# Patient Record
Sex: Female | Born: 1945 | Race: White | Hispanic: No | Smoking: Never smoker
Health system: Southern US, Community
[De-identification: ages and names within clinical notes are randomized; demographics above are authoritative.]

## PROBLEM LIST (undated history)

## (undated) DIAGNOSIS — Z8612 Personal history of poliomyelitis: Secondary | ICD-10-CM

## (undated) DIAGNOSIS — M1990 Unspecified osteoarthritis, unspecified site: Secondary | ICD-10-CM

## (undated) DIAGNOSIS — E7800 Pure hypercholesterolemia, unspecified: Secondary | ICD-10-CM

## (undated) DIAGNOSIS — L900 Lichen sclerosus et atrophicus: Secondary | ICD-10-CM

## (undated) DIAGNOSIS — Z860100 Personal history of colon polyps, unspecified: Secondary | ICD-10-CM

## (undated) DIAGNOSIS — R51 Headache: Secondary | ICD-10-CM

## (undated) DIAGNOSIS — F411 Generalized anxiety disorder: Secondary | ICD-10-CM

## (undated) DIAGNOSIS — I10 Essential (primary) hypertension: Secondary | ICD-10-CM

## (undated) DIAGNOSIS — M797 Fibromyalgia: Secondary | ICD-10-CM

## (undated) DIAGNOSIS — K219 Gastro-esophageal reflux disease without esophagitis: Secondary | ICD-10-CM

## (undated) HISTORY — DX: Gastro-esophageal reflux disease without esophagitis: K21.9

## (undated) HISTORY — DX: Headache: R51

## (undated) HISTORY — DX: Generalized anxiety disorder: F41.1

## (undated) HISTORY — PX: ABDOMINAL HYSTERECTOMY: SHX81

## (undated) HISTORY — DX: Personal history of colon polyps, unspecified: Z86.0100

## (undated) HISTORY — DX: Lichen sclerosus et atrophicus: L90.0

## (undated) HISTORY — DX: Unspecified osteoarthritis, unspecified site: M19.90

## (undated) HISTORY — DX: Pure hypercholesterolemia, unspecified: E78.00

## (undated) HISTORY — DX: Essential (primary) hypertension: I10

## (undated) HISTORY — DX: Personal history of poliomyelitis: Z86.12

## (undated) HISTORY — PX: OTHER SURGICAL HISTORY: SHX169

## (undated) HISTORY — DX: Personal history of colonic polyps: Z86.010

## (undated) HISTORY — DX: Fibromyalgia: M79.7

---

## 1998-05-21 ENCOUNTER — Inpatient Hospital Stay (HOSPITAL_COMMUNITY): Admission: EM | Admit: 1998-05-21 | Discharge: 1998-05-22 | Payer: Self-pay | Admitting: Emergency Medicine

## 1998-08-18 ENCOUNTER — Other Ambulatory Visit: Admission: RE | Admit: 1998-08-18 | Discharge: 1998-08-18 | Payer: Self-pay | Admitting: Gastroenterology

## 1999-05-01 ENCOUNTER — Emergency Department (HOSPITAL_COMMUNITY): Admission: EM | Admit: 1999-05-01 | Discharge: 1999-05-02 | Payer: Self-pay

## 1999-05-01 ENCOUNTER — Encounter: Payer: Self-pay | Admitting: Specialist

## 1999-08-03 ENCOUNTER — Encounter: Admission: RE | Admit: 1999-08-03 | Discharge: 1999-08-03 | Payer: Self-pay | Admitting: Obstetrics and Gynecology

## 1999-09-02 ENCOUNTER — Other Ambulatory Visit: Admission: RE | Admit: 1999-09-02 | Discharge: 1999-09-02 | Payer: Self-pay | Admitting: Obstetrics and Gynecology

## 2000-08-04 ENCOUNTER — Encounter: Payer: Self-pay | Admitting: Obstetrics and Gynecology

## 2000-08-04 ENCOUNTER — Encounter: Admission: RE | Admit: 2000-08-04 | Discharge: 2000-08-04 | Payer: Self-pay | Admitting: Obstetrics and Gynecology

## 2000-09-02 ENCOUNTER — Other Ambulatory Visit: Admission: RE | Admit: 2000-09-02 | Discharge: 2000-09-02 | Payer: Self-pay | Admitting: Obstetrics and Gynecology

## 2000-12-17 ENCOUNTER — Emergency Department (HOSPITAL_COMMUNITY): Admission: EM | Admit: 2000-12-17 | Discharge: 2000-12-17 | Payer: Self-pay | Admitting: Emergency Medicine

## 2000-12-17 ENCOUNTER — Encounter: Payer: Self-pay | Admitting: Emergency Medicine

## 2001-08-07 ENCOUNTER — Encounter: Admission: RE | Admit: 2001-08-07 | Discharge: 2001-08-07 | Payer: Self-pay | Admitting: Obstetrics and Gynecology

## 2001-08-07 ENCOUNTER — Encounter: Payer: Self-pay | Admitting: Obstetrics and Gynecology

## 2001-09-04 ENCOUNTER — Other Ambulatory Visit: Admission: RE | Admit: 2001-09-04 | Discharge: 2001-09-04 | Payer: Self-pay | Admitting: Obstetrics and Gynecology

## 2002-09-11 ENCOUNTER — Encounter: Admission: RE | Admit: 2002-09-11 | Discharge: 2002-09-11 | Payer: Self-pay | Admitting: Obstetrics and Gynecology

## 2002-09-11 ENCOUNTER — Encounter: Payer: Self-pay | Admitting: Obstetrics and Gynecology

## 2003-09-24 ENCOUNTER — Encounter: Admission: RE | Admit: 2003-09-24 | Discharge: 2003-09-24 | Payer: Self-pay | Admitting: Obstetrics and Gynecology

## 2004-10-16 ENCOUNTER — Ambulatory Visit (HOSPITAL_COMMUNITY): Admission: RE | Admit: 2004-10-16 | Discharge: 2004-10-16 | Payer: Self-pay | Admitting: Obstetrics and Gynecology

## 2005-07-26 ENCOUNTER — Ambulatory Visit: Payer: Self-pay | Admitting: Pulmonary Disease

## 2005-09-28 ENCOUNTER — Ambulatory Visit: Payer: Self-pay | Admitting: Pulmonary Disease

## 2005-10-19 ENCOUNTER — Encounter: Admission: RE | Admit: 2005-10-19 | Discharge: 2005-10-19 | Payer: Self-pay | Admitting: Obstetrics and Gynecology

## 2006-08-10 ENCOUNTER — Ambulatory Visit: Payer: Self-pay | Admitting: Pulmonary Disease

## 2006-11-02 ENCOUNTER — Encounter: Admission: RE | Admit: 2006-11-02 | Discharge: 2006-11-02 | Payer: Self-pay | Admitting: Obstetrics and Gynecology

## 2007-08-11 ENCOUNTER — Ambulatory Visit: Payer: Self-pay | Admitting: Pulmonary Disease

## 2007-08-11 DIAGNOSIS — IMO0001 Reserved for inherently not codable concepts without codable children: Secondary | ICD-10-CM

## 2007-08-11 DIAGNOSIS — R519 Headache, unspecified: Secondary | ICD-10-CM | POA: Insufficient documentation

## 2007-08-11 DIAGNOSIS — F411 Generalized anxiety disorder: Secondary | ICD-10-CM | POA: Insufficient documentation

## 2007-08-11 DIAGNOSIS — E7800 Pure hypercholesterolemia, unspecified: Secondary | ICD-10-CM

## 2007-08-11 DIAGNOSIS — M159 Polyosteoarthritis, unspecified: Secondary | ICD-10-CM | POA: Insufficient documentation

## 2007-08-11 DIAGNOSIS — K219 Gastro-esophageal reflux disease without esophagitis: Secondary | ICD-10-CM | POA: Insufficient documentation

## 2007-08-11 DIAGNOSIS — I10 Essential (primary) hypertension: Secondary | ICD-10-CM | POA: Insufficient documentation

## 2007-08-11 DIAGNOSIS — R51 Headache: Secondary | ICD-10-CM

## 2007-08-11 DIAGNOSIS — D126 Benign neoplasm of colon, unspecified: Secondary | ICD-10-CM | POA: Insufficient documentation

## 2007-08-11 LAB — CONVERTED CEMR LAB
AST: 23 units/L (ref 0–37)
Albumin: 3.5 g/dL (ref 3.5–5.2)
Bilirubin, Direct: 0.1 mg/dL (ref 0.0–0.3)
Calcium: 9.6 mg/dL (ref 8.4–10.5)
Chloride: 101 meq/L (ref 96–112)
Cholesterol: 287 mg/dL (ref 0–200)
Eosinophils Relative: 5.4 % — ABNORMAL HIGH (ref 0.0–5.0)
GFR calc Af Amer: 161 mL/min
HCT: 36.3 % (ref 36.0–46.0)
Hemoglobin: 12.9 g/dL (ref 12.0–15.0)
Monocytes Absolute: 0.4 10*3/uL (ref 0.2–0.7)
Monocytes Relative: 7 % (ref 3.0–11.0)
Neutrophils Relative %: 43.6 % (ref 43.0–77.0)
Platelets: 239 10*3/uL (ref 150–400)
Potassium: 3.9 meq/L (ref 3.5–5.1)
RDW: 12.2 % (ref 11.5–14.6)
Sodium: 138 meq/L (ref 135–145)
Total Bilirubin: 0.6 mg/dL (ref 0.3–1.2)
WBC: 5.4 10*3/uL (ref 4.5–10.5)

## 2007-11-10 ENCOUNTER — Encounter: Admission: RE | Admit: 2007-11-10 | Discharge: 2007-11-10 | Payer: Self-pay | Admitting: Obstetrics and Gynecology

## 2008-05-17 ENCOUNTER — Telehealth: Payer: Self-pay | Admitting: Pulmonary Disease

## 2008-05-17 ENCOUNTER — Encounter: Payer: Self-pay | Admitting: Pulmonary Disease

## 2008-06-25 ENCOUNTER — Ambulatory Visit: Payer: Self-pay | Admitting: Gastroenterology

## 2008-07-10 ENCOUNTER — Ambulatory Visit: Payer: Self-pay | Admitting: Gastroenterology

## 2008-07-10 ENCOUNTER — Encounter: Payer: Self-pay | Admitting: Gastroenterology

## 2008-07-25 ENCOUNTER — Telehealth: Payer: Self-pay | Admitting: Gastroenterology

## 2008-07-30 ENCOUNTER — Ambulatory Visit: Payer: Self-pay | Admitting: Gastroenterology

## 2008-07-31 ENCOUNTER — Encounter: Payer: Self-pay | Admitting: Gastroenterology

## 2008-07-31 ENCOUNTER — Ambulatory Visit: Payer: Self-pay | Admitting: Gastroenterology

## 2008-08-05 ENCOUNTER — Encounter: Payer: Self-pay | Admitting: Gastroenterology

## 2008-08-06 ENCOUNTER — Ambulatory Visit: Payer: Self-pay | Admitting: Pulmonary Disease

## 2008-08-06 ENCOUNTER — Encounter: Payer: Self-pay | Admitting: Adult Health

## 2008-08-06 DIAGNOSIS — A809 Acute poliomyelitis, unspecified: Secondary | ICD-10-CM | POA: Insufficient documentation

## 2008-08-16 ENCOUNTER — Telehealth: Payer: Self-pay | Admitting: Pulmonary Disease

## 2008-08-17 ENCOUNTER — Encounter: Payer: Self-pay | Admitting: Pulmonary Disease

## 2008-08-26 ENCOUNTER — Telehealth (INDEPENDENT_AMBULATORY_CARE_PROVIDER_SITE_OTHER): Payer: Self-pay | Admitting: *Deleted

## 2008-09-04 ENCOUNTER — Telehealth: Payer: Self-pay | Admitting: Pulmonary Disease

## 2008-09-04 ENCOUNTER — Encounter: Payer: Self-pay | Admitting: Pulmonary Disease

## 2008-09-04 LAB — CONVERTED CEMR LAB
ALT: 20 units/L (ref 0–35)
AST: 23 units/L (ref 0–37)
Albumin: 3.7 g/dL (ref 3.5–5.2)
Alkaline Phosphatase: 92 units/L (ref 39–117)
BUN: 13 mg/dL (ref 6–23)
Basophils Absolute: 0 10*3/uL (ref 0.0–0.1)
Basophils Relative: 0.7 % (ref 0.0–3.0)
Bilirubin, Direct: 0.1 mg/dL (ref 0.0–0.3)
CO2: 30 meq/L (ref 19–32)
Calcium: 9.4 mg/dL (ref 8.4–10.5)
Chloride: 104 meq/L (ref 96–112)
Cholesterol: 281 mg/dL (ref 0–200)
Creatinine, Ser: 0.6 mg/dL (ref 0.4–1.2)
Crystals: NEGATIVE
Direct LDL: 113.4 mg/dL
Eosinophils Absolute: 0.2 10*3/uL (ref 0.0–0.7)
Eosinophils Relative: 4.1 % (ref 0.0–5.0)
GFR calc Af Amer: 130 mL/min
GFR calc non Af Amer: 108 mL/min
Glucose, Bld: 99 mg/dL (ref 70–99)
HCT: 37.7 % (ref 36.0–46.0)
HDL: 117.9 mg/dL (ref >39.0)
Hemoglobin, Urine: NEGATIVE
Hemoglobin: 13.2 g/dL (ref 12.0–15.0)
Ketones, ur: NEGATIVE mg/dL
Leukocytes, UA: NEGATIVE
Lymphocytes Relative: 44.5 % (ref 12.0–46.0)
MCHC: 35.1 g/dL (ref 30.0–36.0)
MCV: 93.9 fL (ref 78.0–100.0)
Monocytes Absolute: 0.4 10*3/uL (ref 0.1–1.0)
Monocytes Relative: 6.2 % (ref 3.0–12.0)
Neutro Abs: 2.5 10*3/uL (ref 1.4–7.7)
Neutrophils Relative %: 44.5 % (ref 43.0–77.0)
Nitrite: NEGATIVE
Platelets: 210 10*3/uL (ref 150–400)
Potassium: 3.7 meq/L (ref 3.5–5.1)
RBC: 4.02 M/uL (ref 3.87–5.11)
RDW: 12.6 % (ref 11.5–14.6)
Sodium: 142 meq/L (ref 135–145)
Specific Gravity, Urine: 1.02 (ref 1.000–1.03)
TSH: 1.5 microintl units/mL (ref 0.35–5.50)
Total Bilirubin: 0.5 mg/dL (ref 0.3–1.2)
Total CHOL/HDL Ratio: 2.4
Total Protein, Urine: NEGATIVE mg/dL
Total Protein: 7.5 g/dL (ref 6.0–8.3)
Triglycerides: 81 mg/dL (ref 0–149)
Urine Glucose: NEGATIVE mg/dL
Urobilinogen, UA: 0.2 (ref 0.0–1.0)
VLDL: 16 mg/dL (ref 0–40)
Vit D, 1,25-Dihydroxy: 21 — ABNORMAL LOW (ref 30–89)
WBC: 5.7 10*3/uL (ref 4.5–10.5)
pH: 6 (ref 5.0–8.0)

## 2008-09-09 ENCOUNTER — Telehealth: Payer: Self-pay | Admitting: Pulmonary Disease

## 2008-10-08 ENCOUNTER — Ambulatory Visit: Payer: Self-pay | Admitting: Pulmonary Disease

## 2008-11-11 ENCOUNTER — Encounter: Admission: RE | Admit: 2008-11-11 | Discharge: 2008-11-11 | Payer: Self-pay | Admitting: Pulmonary Disease

## 2009-01-06 ENCOUNTER — Telehealth (INDEPENDENT_AMBULATORY_CARE_PROVIDER_SITE_OTHER): Payer: Self-pay | Admitting: *Deleted

## 2009-02-26 ENCOUNTER — Encounter: Payer: Self-pay | Admitting: Obstetrics & Gynecology

## 2009-02-26 ENCOUNTER — Ambulatory Visit: Payer: Self-pay | Admitting: Obstetrics & Gynecology

## 2009-02-26 ENCOUNTER — Telehealth: Payer: Self-pay | Admitting: Pulmonary Disease

## 2009-02-26 ENCOUNTER — Other Ambulatory Visit: Admission: RE | Admit: 2009-02-26 | Discharge: 2009-02-26 | Payer: Self-pay | Admitting: Obstetrics & Gynecology

## 2009-05-23 ENCOUNTER — Ambulatory Visit: Payer: Self-pay | Admitting: Diagnostic Radiology

## 2009-05-23 ENCOUNTER — Emergency Department (HOSPITAL_BASED_OUTPATIENT_CLINIC_OR_DEPARTMENT_OTHER): Admission: EM | Admit: 2009-05-23 | Discharge: 2009-05-23 | Payer: Self-pay | Admitting: Emergency Medicine

## 2009-07-09 ENCOUNTER — Encounter (INDEPENDENT_AMBULATORY_CARE_PROVIDER_SITE_OTHER): Payer: Self-pay | Admitting: *Deleted

## 2009-08-13 ENCOUNTER — Telehealth: Payer: Self-pay | Admitting: Pulmonary Disease

## 2009-08-19 ENCOUNTER — Encounter: Payer: Self-pay | Admitting: Pulmonary Disease

## 2009-08-20 ENCOUNTER — Telehealth: Payer: Self-pay | Admitting: Pulmonary Disease

## 2009-10-09 ENCOUNTER — Ambulatory Visit: Payer: Self-pay | Admitting: Pulmonary Disease

## 2009-10-09 ENCOUNTER — Encounter: Payer: Self-pay | Admitting: Internal Medicine

## 2009-10-14 ENCOUNTER — Telehealth: Payer: Self-pay | Admitting: Pulmonary Disease

## 2009-10-19 DIAGNOSIS — E559 Vitamin D deficiency, unspecified: Secondary | ICD-10-CM | POA: Insufficient documentation

## 2009-10-19 LAB — CONVERTED CEMR LAB
ALT: 21 units/L (ref 0–35)
AST: 28 units/L (ref 0–37)
Basophils Absolute: 0 10*3/uL (ref 0.0–0.1)
Basophils Relative: 0.6 % (ref 0.0–3.0)
CO2: 28 meq/L (ref 19–32)
Cholesterol: 290 mg/dL — ABNORMAL HIGH (ref 0–200)
Eosinophils Absolute: 0.2 10*3/uL (ref 0.0–0.7)
Eosinophils Relative: 3.4 % (ref 0.0–5.0)
Glucose, Bld: 91 mg/dL (ref 70–99)
HDL: 119.8 mg/dL (ref >39.00)
Lymphs Abs: 2.7 10*3/uL (ref 0.7–4.0)
MCHC: 33.6 g/dL (ref 30.0–36.0)
MCV: 96.8 fL (ref 78.0–100.0)
Monocytes Absolute: 0.3 10*3/uL (ref 0.1–1.0)
Neutro Abs: 2.4 10*3/uL (ref 1.4–7.7)
Neutrophils Relative %: 42.8 % — ABNORMAL LOW (ref 43.0–77.0)
Platelets: 227 10*3/uL (ref 150.0–400.0)
RBC: 4.26 M/uL (ref 3.87–5.11)
RDW: 12.6 % (ref 11.5–14.6)
Total Bilirubin: 0.8 mg/dL (ref 0.3–1.2)
Total CHOL/HDL Ratio: 2
Total Protein: 8.1 g/dL (ref 6.0–8.3)
VLDL: 10.2 mg/dL (ref 0.0–40.0)
Vit D, 25-Hydroxy: 43 ng/mL (ref 30–89)

## 2009-10-21 ENCOUNTER — Telehealth: Payer: Self-pay | Admitting: Pulmonary Disease

## 2010-03-09 ENCOUNTER — Encounter: Admission: RE | Admit: 2010-03-09 | Discharge: 2010-03-09 | Payer: Self-pay | Admitting: Pulmonary Disease

## 2010-03-11 ENCOUNTER — Ambulatory Visit: Payer: Self-pay | Admitting: Obstetrics & Gynecology

## 2010-03-17 ENCOUNTER — Encounter: Admission: RE | Admit: 2010-03-17 | Discharge: 2010-03-17 | Payer: Self-pay | Admitting: Obstetrics & Gynecology

## 2010-04-07 ENCOUNTER — Ambulatory Visit: Payer: Self-pay | Admitting: Obstetrics & Gynecology

## 2010-06-23 ENCOUNTER — Telehealth (INDEPENDENT_AMBULATORY_CARE_PROVIDER_SITE_OTHER): Payer: Self-pay | Admitting: *Deleted

## 2010-10-13 ENCOUNTER — Ambulatory Visit
Admission: RE | Admit: 2010-10-13 | Discharge: 2010-10-13 | Payer: Self-pay | Source: Home / Self Care | Attending: Pulmonary Disease | Admitting: Pulmonary Disease

## 2010-10-13 ENCOUNTER — Ambulatory Visit: Payer: Self-pay | Admitting: Pulmonary Disease

## 2010-10-13 ENCOUNTER — Encounter: Payer: Self-pay | Admitting: Pulmonary Disease

## 2010-10-20 LAB — CONVERTED CEMR LAB
Albumin: 3.9 g/dL (ref 3.5–5.2)
BUN: 16 mg/dL (ref 6–23)
CO2: 27 meq/L (ref 19–32)
Calcium: 9.6 mg/dL (ref 8.4–10.5)
Chloride: 103 meq/L (ref 96–112)
Eosinophils Absolute: 0.2 10*3/uL (ref 0.0–0.7)
HDL: 86.4 mg/dL (ref >39.00)
Hemoglobin: 13 g/dL (ref 12.0–15.0)
Ketones, ur: NEGATIVE mg/dL
Lymphs Abs: 2.3 10*3/uL (ref 0.7–4.0)
MCHC: 33.9 g/dL (ref 30.0–36.0)
MCV: 94.7 fL (ref 78.0–100.0)
Monocytes Absolute: 0.4 10*3/uL (ref 0.1–1.0)
Potassium: 4.2 meq/L (ref 3.5–5.1)
Sodium: 141 meq/L (ref 135–145)
Specific Gravity, Urine: <=1.005 (ref 1.000–1.030)
TSH: 1.93 microintl units/mL (ref 0.35–5.50)
Total CHOL/HDL Ratio: 3
Total Protein: 7.2 g/dL (ref 6.0–8.3)
Triglycerides: 85 mg/dL (ref 0.0–149.0)
Urobilinogen, UA: 0.2 (ref 0.0–1.0)
VLDL: 17 mg/dL (ref 0.0–40.0)
pH: 6 (ref 5.0–8.0)

## 2010-11-08 ENCOUNTER — Encounter: Payer: Self-pay | Admitting: Obstetrics & Gynecology

## 2010-11-09 ENCOUNTER — Encounter: Payer: Self-pay | Admitting: Obstetrics & Gynecology

## 2010-11-17 NOTE — Progress Notes (Signed)
Summary: PRESCRIPT-need PA  Phone Note Call from Patient   Caller: Patient Call For: NADEL Summary of Call: INSURANCE WILL NOT COVER ALPRAZALAM  UNLESS CALL FROM DR NADEL OR NEED TO KNOW ALTERNATIVE Initial call taken by: Rickard Patience,  October 21, 2009 2:04 PM  Follow-up for Phone Call        [facility].Michel Bickers CMA  October 22, 2009 9:46 AM  I advised pt that Medicare has no alternative that is covered for alprazolam. Pt staes the letter she received rec a PA to be done for review. I advised we can try the PA to see if med will be covered, but if it is denied she will prob have to pay oput of pocket for this med. PA number is [phone]. Carron Curie CMA  October 22, 2009 10:20 AM  Spoke with [facility] and they stated that alprazolam is not covered by medicare. They will fax a denial letter and on it will be a number listed that can be called for an appeal if needed.  Once this is received I will give it to Sn to review. I have sent my self a flag to check on this fax tomorrow.  Carron Curie CMA  October 22, 2009 4:04 PM

## 2010-11-17 NOTE — Progress Notes (Signed)
Summary: flu shot----ok to get vaccine  Phone Note Call from Patient   Caller: Patient Call For: nadel Summary of Call: pt want to no if she need to get flu shot this year. Initial call taken by: Rickard Patience,  June 23, 2010 4:01 PM  Follow-up for Phone Call        called and spoke with pt.  pt wanted to know if SN recommends she gets the flu vaccine.  Pt has gotten them previously because of her husband having cancer. But since pt's husband has passed away as of 2011/03/06she was unsure if she still needed to get Flu vaccine.  Will forward message to SN to address.  Aundra Millet Reynolds LPN  June 23, 2010 4:14 PM   Additional Follow-up for Phone Call Additional follow up Details #1::        per SN---yes he does rec that she get a flu shot this year as well.  thanks and sorry to hear about her loss. Randell Loop [facility]  June 23, 2010 4:34 PM     Additional Follow-up for Phone Call Additional follow up Details #2::    called and spoke with pt.  pt aware of SN's recs to ok getting flu vaccine. pt unsure if she will get flu vaccine here or at local pharmacy- states she will go where ever is closer. pt states she will call back to schedule flu vaccine if she wishes to get it here.   Aundra Millet Reynolds LPN  June 23, 2010 4:37 PM

## 2010-11-19 NOTE — Assessment & Plan Note (Signed)
Summary: Veronica Strong   CC:  Yearly ROV & CPX....  History of Present Illness: 65 y/o WF here for a yearly follow up visit... he has multiple medical problems as noted below...     ~  October 09, 2009:  she is under considerable stress- new husb, [name], is dying w/ lung cancer & liver mets, Rx at [facility]- on Hospice now... meds refilled- see problem list below.   ~  October 13, 2010:  Yearly ROV & f/u HBP, Chol, etc... she reports much stress w/ death of her second husb & several family members this yr... medically OK w/ 13# weight reduction on diet, BP stable, trying to control Chol w/ diet alone + KrillOil now (refuses statin rx)... she sees DrDove in [place] GYN w/ BMD 25/11 showing Osteopenia & started on Alendronate...  she had seasonal flu vaccine already, and due to TDAP today (she will turn 64 soon & get Pneumovax in 2012)... wants 90d refill meds today.    Current Problem List:   HYPERTENSION (ICD-401.9) - on ASA 81mg /d,  NORVASC 10mg /d... BP= 130/72 & feeling well, takes med regularly, tolerated well... denies visual changes, CP, palipit, dizziness, syncope, dyspnea, edema, etc...  ~  12/11:  great job on diet & wt down 13# to 147# today, BP= 130/72 doing well.  ~  CXR 12/11 showed normal cor, clear lungs...  HYPERCHOLESTEROLEMIA (ICD-272.0) - on Krill Oil + diet... she has very hi HDL's... refuses statin rx.  ~  despite TChol in the 280-300 range & LDL in the 120-140 range, her HDL is in the 120-140 range, and she has chosen NOT to take statin med to bring the Total and LDL down...  ~  FLP 10/08 showed TChol 287, TG 102, HDL 125, LDL 138  ~  FLP 10/09 shows TChol 281, TG 81, HDL 118, LDL 113  ~  FLP 12/10 showed TChol 290, TG 51, HDL 120, LDL 147... offered low dose Statin, she prefers diet.  ~  FLP 12/11 showed TChol 278, TG 85, HDL 86, LDL 157... refuses even low dose statin rx.  GERD (ICD-530.81) - on OMEPRAZOLE 20mg /d... she had an EGD in 1999 w/ 4-5cmHH and mod  reflux...  COLONIC POLYPS (ICD-211.3) - had f/u colonoscopy by DrPatterson on 07/10/08- sigmiod polyp removed= tubular adenoma;  also had abn in cecum (granularity/ erosions) w/ bx showing frag of tubular adenoma w/ high grade dysplasia... repeat colonoscopy 07/31/08 showed nodular folds in the cecum but bx was neg= colonic mucosa w/ adenomatous change... he plans f/u in 4yrs.  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 she fell at home in 1993 and fractured her right prox tibia and metatarsals in her foot...  ~  she has a brace on her right leg due to hx polio...  ? of FIBROMYALGIA (ICD-729.1) - she has some fatigue & trigger points in the past...  VITAMIN D DEFICIENCY (ICD-268.9) - on Vit D 50K for 52yr then switched to 1000 u OTC daily...  ~  labs 10/09 showed Vit D level = 21... rec> Vit D 50K weekly...  ~  labd 12/10 showed Vit D level = 43... rec> switch to 2000 u daily.  Hx of POLIOMYELITIS (ICD-045.90) - she has atrophy of muscles in right leg and uses a long leg brace per [facility]... DrSue wrote her out of work on disability in 1999...  HEADACHE (ICD-784.0) - she uses ANTIVERT for dizziness Prn & OTC pain meds Prn.  ANXIETY DISORDER,  GENERALIZED (ICD-300.02) - on ALPRAZOLAM 0.5mg  Tid Prn...  Health Maintenance - GYN= DrDove in [place] on Alendronate, +Calcium, +MVI, +VitD... she is up to date on vaccinations:  gets the yearly seasonal flu vaccine;  given TDAP here 12/11;  due for Pneumovax at age 58 in 2012...   Preventive Screening-Counseling & Management  Alcohol-Tobacco     Smoking Status: never  Allergies: 1)  ! Sulfa 2)  ! Penicillin  Comments:  Nurse/Medical Assistant: The patient's medications and allergies were reviewed with the patient and were updated in the Medication and Allergy Lists.  Past History:  Past Medical History: HYPERTENSION (ICD-401.9) HYPERCHOLESTEROLEMIA (ICD-272.0) GERD (ICD-530.81) COLONIC POLYPS  (ICD-211.3) DEGENERATIVE JOINT DISEASE, GENERALIZED (ICD-715.00) FIBROMYALGIA (ICD-729.1) VITAMIN D DEFICIENCY (ICD-268.9) Hx of POLIOMYELITIS (ICD-045.90) HEADACHE (ICD-784.0) ANXIETY DISORDER, GENERALIZED (ICD-300.02)  Past Surgical History: S/P orthopedic surg related to her polio as a child S/P hysterectomy  Family History: Reviewed history from 08/06/2008 and no changes required. mother alive age 65  hx of mini strokes, CHF father deceased age 58 from prostate/bladder cancer 1 sibling(twin) alive age 10 1 sibling alive age 2  hx of mini strokes/TIA 1 sibling alive age 30  hx of stroke 1 sibling alive age 49  hx of stroke/circulation problems to legs  Social History: Reviewed history from 08/06/2008 and no changes required. widowed 1 child non smoker caffeine use:  3-4 cups per day no etoh  Review of Systems      See HPI  The patient denies anorexia, fever, weight loss, weight gain, vision loss, decreased hearing, hoarseness, chest pain, syncope, dyspnea on exertion, peripheral edema, prolonged cough, headaches, hemoptysis, abdominal pain, melena, hematochezia, severe indigestion/heartburn, hematuria, incontinence, muscle weakness, suspicious skin lesions, transient blindness, difficulty walking, depression, unusual weight change, abnormal bleeding, enlarged lymph nodes, and angioedema.         12 system review was otherw neg...  Vital Signs:  Patient profile:   65 year old female Height:      63 inches Weight:      146.38 pounds BMI:     26.02 O2 Sat:      99 % on Room air Temp:     97.6 degrees F oral Pulse rate:   70 / minute BP sitting:   130 / 72  (right arm) Cuff size:   regular  Vitals Entered By: Randell Loop CMA (October 13, 2010 8:52 AM)  O2 Sat at Rest %:  99 O2 Flow:  Room air CC: Yearly ROV & CPX... Is Patient Diabetic? No Pain Assessment Patient in pain? no      Comments meds updated today with pt   Physical Exam  Additional Exam:   WD, WN, 65 y/o WF in NAD... GENERAL:  Alert & oriented; pleasant & cooperative... HEENT:  [place]/AT, EOM-wnl, PERRLA, EACs-clear, TMs-wnl, NOSE-clear, THROAT-clear & wnl. NECK:  Supple w/ fairROM; no JVD; normal carotid impulses w/o bruits; no thyromegaly or nodules palpated; no lymphadenopathy. CHEST:  Clear to P & A; without wheezes/ rales/ or rhonchi heard... HEART:  Regular Rhythm; without murmurs/ rubs/ or gallops detected... ABDOMEN:  Soft & nontender; normal bowel sounds; no organomegaly or masses palpated... EXT:  hx polio right leg w/ musc arophy, shorter, long leg brace in place,  mild arthritic changes;  no varicose veins/ sl venous insuffic/ tr edema.... NEURO:  CN's intact; focal weakness in right leg from old polio, sensory testing normal; gait abnormal & balance fair. DERM:  No lesions noted; no rash etc...    MISC. Report  Procedure date:  10/13/2010  Findings:       ~  CXR clear, WNL.Marland Kitchen.  ~  Labs 12/27 showed WNL x for Chol & LDL... pt notified but she declines statin Rx...  Impression & Recommendations:  Problem # 1:  HYPERTENSION (ICD-401.9) BP controlled>  continue same med. Orders: 12 Lead EKG (12 Lead EKG) TLB-BMP (Basic Metabolic Panel-BMET) (80048-METABOL) TLB-Hepatic/Liver Function Pnl (80076-HEPATIC) TLB-CBC Platelet - w/Differential (85025-CBCD) TLB-Lipid Panel (80061-LIPID) TLB-TSH (Thyroid Stimulating Hormone) (84443-TSH) TLB-Udip w/ Micro (81001-URINE)  Her updated medication list for this problem includes:    Amlodipine Besylate 10 Mg Tabs (Amlodipine besylate) .Marland Kitchen... Take 1 tablet by mouth once a day  Problem # 2:  HYPERCHOLESTEROLEMIA (ICD-272.0) TChol & LDL are too high but she declines offer for low dose statin therapy... she wants to continue her "mega-red krill oil"... offered lipid clinic...  Problem # 3:  GERD (ICD-530.81) GI is stable & up to date... Her updated medication list for this problem includes:    Omeprazole 20 Mg Cpdr  (Omeprazole) .Marland Kitchen... Take 1 tablet by mouth once a day  Problem # 4:  DEGENERATIVE JOINT DISEASE, GENERALIZED (ICD-715.00) Aware>  she uses the Etod Prn... Her updated medication list for this problem includes:    Aspirin 81 Mg Tabs (Aspirin) .Marland Kitchen... Take 1 tablet by mouth once a day    Etodolac 400 Mg Tabs (Etodolac) .Marland Kitchen... Take 1 tablet by mouth two times a day as needed for arthritis pain...  Problem # 5:  Hx of POLIOMYELITIS (ICD-045.90) She has leg brace & limp...  Problem # 6:  OTHER MEDICAL PROBLEMS AS NOTED>>>  Complete Medication List: 1)  Aspirin 81 Mg Tabs (Aspirin) .... Take 1 tablet by mouth once a day 2)  Amlodipine Besylate 10 Mg Tabs (Amlodipine besylate) .... Take 1 tablet by mouth once a day 3)  Omega-3 Krill Oil 300 Mg Caps (Krill oil) .... Take 1 tablet by mouth once a day 4)  Omeprazole 20 Mg Cpdr (Omeprazole) .... Take 1 tablet by mouth once a day 5)  Black Cohosh 540 Mg Caps (Black cohosh) .... Take 1 tablet by mouth once a day 6)  Etodolac 400 Mg Tabs (Etodolac) .... Take 1 tablet by mouth two times a day as needed for arthritis pain.Marland KitchenMarland Kitchen 7)  Alendronate Sodium 70 Mg Tabs (Alendronate sodium) .... Take one tablet by mouth once weekly 8)  Calcium-vitamin D 600-200 Mg-unit Tabs (Calcium-vitamin d) .... Take 1 tablet by mouth once a day 9)  Multivitamins Tabs (Multiple vitamin) .... Take 1 tablet by mouth once a day 10)  Vitamin D 2000 Unit Tabs (Cholecalciferol) .... Take 1 tablet by mouth once a day 11)  Alprazolam 0.5 Mg Tabs (Alprazolam) .... Take one tablet three times a day as needed for nerves... 12)  Tylenol Pm Extra Strength 500-25 Mg Tabs (Diphenhydramine-apap (sleep)) .... Take 2 tablets by mouth at bedtime 13)  Vitamin C 1000 Mg Tabs (Ascorbic acid) .... Take 1 tablet by mouth once a day 14)  Meclizine Hcl 25 Mg Tabs (Meclizine hcl) .... Take 1 tab by mouth every 6h as needed for dizziness...  Other Orders: T-2 View CXR (71020TC) Tdap => 38yrs IM (14782) Admin  1st Vaccine (95621)  Patient Instructions: 1)  Today we updated your med list- see below.... 2)  We refilled your meds for 2012... 3)  Today we did your follow up CXR, EKG, and FASTING blood work... please call the "phone tree" in a few days for your lab results.Marland KitchenMarland Kitchen 4)  We also gave you the combination Tetanus vaccine called the TDAP- it is good for 66yrs.Marland KitchenMarland Kitchen 5)  Call for any problems.Marland KitchenMarland Kitchen 6)  Please schedule a follow-up appointment in 1 year, sooner as needed. Prescriptions: MECLIZINE HCL 25 MG TABS (MECLIZINE HCL) take 1 tab by mouth every 6H as needed for dizziness...  #100 x 6   Entered and Authorized by:   Michele Mcalpine MD   Signed by:   Michele Mcalpine MD on 10/13/2010   Method used:   Print then Give to Patient   RxID:   [id number] ALPRAZOLAM 0.5 MG  TABS (ALPRAZOLAM) take one tablet three times a day as needed for nerves...  #270 x 3   Entered and Authorized by:   Michele Mcalpine MD   Signed by:   Michele Mcalpine MD on 10/13/2010   Method used:   Print then Give to Patient   RxID:   [id number] ETODOLAC 400 MG  TABS (ETODOLAC) Take 1 tablet by mouth two times a day as needed for arthritis pain...  #100 x 6   Entered and Authorized by:   Michele Mcalpine MD   Signed by:   Michele Mcalpine MD on 10/13/2010   Method used:   Print then Give to Patient   RxID:   [id number] OMEPRAZOLE 20 MG  CPDR (OMEPRAZOLE) Take 1 tablet by mouth once a day  #90 x 4   Entered and Authorized by:   Michele Mcalpine MD   Signed by:   Michele Mcalpine MD on 10/13/2010   Method used:   Print then Give to Patient   RxID:   [id number] AMLODIPINE BESYLATE 10 MG  TABS (AMLODIPINE BESYLATE) Take 1 tablet by mouth once a day  #90 x 4   Entered and Authorized by:   Michele Mcalpine MD   Signed by:   Michele Mcalpine MD on 10/13/2010   Method used:   Print then Give to Patient   RxID:   [id number]    Immunization History:  Influenza Immunization History:    Influenza:  historical  (08/04/2010)  Immunizations Administered:  Tetanus Vaccine:    Vaccine Type: Tdap    Site: right deltoid    Mfr: GlaxoSmithKline    Dose: 0.5 ml    Route: IM    Given by: Randell Loop CMA    Exp. Date: 01/10/2012    Lot #: [id number]    VIS given: 09/04/08 version given October 13, 2010.

## 2010-11-30 ENCOUNTER — Telehealth: Payer: Self-pay | Admitting: Pulmonary Disease

## 2010-12-09 NOTE — Progress Notes (Signed)
Summary: life insurance papers  Phone Note Call from Patient Call back at [facility] Phone [phone]   Caller: Patient Call For: Elnore Cosens Summary of Call: pt is going to fax life ins. papers that she is requesting that dr Gaylord Seydel fill out and sign (and then fax back to ins. co). she will also need a copy mailed back to her. she just wanted dr Jodelle Green nurse to know these would be faxed either today or tomorrow to the attn: of dr Josef Tourigny. no call back needed for now.  Initial call taken by: Tivis Ringer, CNA,  November 30, 2010 3:32 PM  Follow-up for Phone Call        Will forward message to Marliss Czar so she can keep an eye out for the paperwork that pt is to be faxing over to Dr. Jodelle Green attention.Arman Filter LPN  November 30, 2010 4:00 PM   Additional Follow-up for Phone Call Additional follow up Details #1::        aware of papers being sent and will keep my eye out for these Randell Loop CMA  November 30, 2010 5:02 PM

## 2010-12-23 ENCOUNTER — Telehealth: Payer: Self-pay | Admitting: Pulmonary Disease

## 2011-01-05 NOTE — Progress Notes (Signed)
Summary: status of papers - would like copy mailed to her  Phone Note Call from Patient Call back at Home Phone [phone]   Caller: Patient Call For: Taiyana Kissler Summary of Call: Pt wants to know the status of her insurance papers. Initial call taken by: Darletta Moll,  December 23, 2010 3:30 PM  Follow-up for Phone Call        [facility] Gweneth Dimitri RN  December 23, 2010 3:54 PM  Pt returned call.  States she sent in [employer] forms to be completed by Dr. Kriste Basque in Feb.  I see where she called and left message regarding this.  However, pt states she received a letter from them stating this has not been done yet.  She would like to know the status of these forms and if SN ever received them.  States she does have a copy and if needed can fax them tomorrow.  Advised we will call back tomorrow regarding status of these forms.  Pt ok with this.  Leigh/Dr. Kriste Basque, pls advise.  Thanks! Follow-up by: Gweneth Dimitri RN,  December 23, 2010 5:22 PM  Additional Follow-up for Phone Call Additional follow up Details #1::        there are no insurance papers that have been received----if pt could fax these papers to me then we can get these filled out.  thanks Randell Loop CMA  December 24, 2010 12:33 PM   Called, spoke with pt.  She is aware papers were not received.  She will refax them today to [phone] attn: Leigh/Dr. Kriste Basque.  Will forward message to Marliss Czar so she can f/u on this. Additional Follow-up by: Gweneth Dimitri RN,  December 24, 2010 12:43 PM    Additional Follow-up for Phone Call Additional follow up Details #2::    pt calling to see if papers that were re-faxed have been received. [phone]. Tivis Ringer, CNA  December 25, 2010 10:46 AM  Marliss Czar, did you receive these forms? Crystal Jones RN  December 25, 2010 10:49 AM   i did get these papers and have placed them on SN cart so this can be filled out and sent back.  thanks Randell Loop CMA  December 25, 2010 11:37 AM   Pt aware forms received and would  like a copy mailed to her home once they are completed.  Will forward message to Leigh. Follow-up by: Gweneth Dimitri RN,  December 25, 2010 11:47 AM  Additional Follow-up for Phone Call Additional follow up Details #3:: Details for Additional Follow-up Action Taken: these papers are disability papers and will be forwarded to [employer] to be filled out and we can mail a copy to the pt once this is completed Randell Loop CMA  December 25, 2010 5:30 PM   pt aware.Carron Curie CMA  December 28, 2010 8:51 AM

## 2011-01-25 ENCOUNTER — Other Ambulatory Visit: Payer: Self-pay | Admitting: Obstetrics & Gynecology

## 2011-01-25 DIAGNOSIS — Z1231 Encounter for screening mammogram for malignant neoplasm of breast: Secondary | ICD-10-CM

## 2011-03-02 NOTE — Assessment & Plan Note (Signed)
NAME:  Veronica Strong, Veronica Strong                ACCOUNT NO.:  [id number]   MEDICAL RECORD NO.:  [id number]          PATIENT TYPE:  POB   LOCATION:  [facility] at [place]         FACILITY:  [facility]   PHYSICIAN:  Allie Bossier, MD        DATE OF BIRTH:  Jul 03, 1946   DATE OF SERVICE:  03/11/2010                                  CLINIC NOTE   IDENTIFICATION:  Ms. Mcadams is a 65 year old widowed white gravida 2,  para 1, abortus 1 who comes in here for annual exam.  She has no  particular GYN complaints.  She quit taking her Premarin last year after  her prescription ran out.  She decided not to refill it.  She has some  occasional night sweats, but nothing that she cannot cut up with and her  last year has been full of grief.  Her husband died December 17, 2009,  her mother died also in Feb 18, 2011and her brother died earlier this  year as well, but she says that she has not had to increase her Xanax  use and she is getting by.   PAST MEDICAL HISTORY:  Anxiety (Dr. Alroy Dust), depression.  She had a  history of polio at age 75 and has had a brace on her right leg since.  She has hypertension and history of colon polyps and fibromyalgia.   REVIEW OF SYSTEMS:  She is a retired [employer] operational officer.  The remainder of her  review of systems questions are negative.  She had her DEXA more than a  decade ago and her last colonoscopy was in 2010 and is scheduled again  for 2012 due to the history of precancerous polyps.   PAST SURGICAL HISTORY:  She had a TAH in 1981 for benign reasons.  She  had an appendectomy and 2 C-sections.  She has had a right knee and  thigh surgery and a right hammertoe surgery.   ALLERGIES:  No latex allergies. Drug allergies; PENICILLIN, SULFA.   SOCIAL HISTORY:  Negative for tobacco, alcohol, or drug use.   FAMILY HISTORY:  Negative for breast, GYN and colon malignancies.  Positive for osteoporosis and dimension.   PHYSICAL EXAMINATION:  GENERAL:  Well nourished, well hydrated  extremely  pleasant white female.  VITAL SIGNS:  Height 5 feet 3 inches, weight 160, blood pressure 139/83,  pulse 76.  HEENT:  Normal.  BREASTS:  Normal bilaterally.  HEART:  Regular rate and rhythm.  LUNGS:  Clear to auscultation bilaterally.  ABDOMEN:  Slightly obese, benign.  PELVIC:  External genitalia, significant atrophy.  Her vagina has  significant atrophy as does her vaginal cuff.  There are no lesions  throughout the vagina.  Her bimanual reveals no adnexal masses or  tenderness.   ASSESSMENT AND PLAN:  Annual exam.  I have recommended self-breast and  self-vulvar exams.  I did not do a Pap smear since she had a  hysterectomy and her last year's Pap smear was normal.  We will schedule  a bone density scan and see her back in a year or sooner as necessary.      Allie Bossier, MD     MCD/MEDQ  D:  03/11/2010  T:  03/11/2010  Job:  [id number]

## 2011-03-02 NOTE — Assessment & Plan Note (Signed)
NAME:  Veronica, Strong                ACCOUNT NO.:  [id number]   MEDICAL RECORD NO.:  [id number]          PATIENT TYPE:  POB   LOCATION:  [facility] at [place]         [facility]ACILITY[facility]   PHYSICIAN:  Allie Bossier, MD        DATE OF BIRTH:  12/25/1945   DATE OF SERVICE:  04/07/2010                                  CLINIC NOTE   [name] is a 65 year old widowed white lady who is here to discuss her  bone density scan that was done on 03/17/2010.  The assessment was low  bone density mass with a Z-score of -0.3.  Please note that she has a  right leg brace due to history of polio at age 56.  She took Premarin  estrogen replacement therapy for more than 10 years but herself  discontinued it last year.  Also of note, she normally took 1200 mg of  calcium on a daily basis until last year when her daughter bought her  the 600-mg tablets and she did not recognize the difference there.  She  just recently realized that she has only been taking 600 mg a day.  She  also takes vitamin D and has had her vitamin D level checked by Dr.  Lennon Alstrom recently as 7 months ago and was told it was very good.  I have  discussed with her the fact that weight bearing exercises is not  particularly feasible for her in her brace and that I would recommend  increasing her calcium to at least twice a day but that I would also  recommend a bone density drug such as Boniva or Fosamax.  She is willing  to try the Boniva or whichever one her insurance company will pay for  and I have cautioned her to stop it at any sign of jaw pain or muscle  pain in her body.  She will call me if any of these events occur.  I  have written a prescription for Boniva 150 mg monthly.      Allie Bossier, MD     MCD/MEDQ  D:  04/07/2010  T:  04/08/2010  Job:  [id number]

## 2011-03-02 NOTE — Assessment & Plan Note (Signed)
NAME:  Veronica Strong, Veronica Strong                ACCOUNT NO.:  [id number]   MEDICAL RECORD NO.:  [id number]          PATIENT TYPE:  POB   LOCATION:  [facility] at [place]         FACILITY:  [facility]   PHYSICIAN:  Allie Bossier, MD        DATE OF BIRTH:  June 19, 1946   DATE OF SERVICE:  02/26/2009                                  CLINIC NOTE   Veronica Strong is a 65 year old, married, white gravida 2, para 1, abortus 1,  who is here as a new patient for an annual exam.  She has no particular  complaints.   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  Due to her leg brace, she has had right thumb joint  pain for which she wears a brace.  She also has hypertension.  She is  treated for anxiety for many many years by Dr. Alroy Dust.  She did  have an abnormal Pap smear done at Dr. Ebony Hail office in 2008 required  a colposcopy and I will followup Pap smear.  She had colon polyps  removed last year and is scheduled for a repeat colonoscopy this year,  and she was also given a diagnosis of a little fibromyalgia.   REVIEW OF SYSTEMS:  She is retired [employer] operational officer.  She has been married to  her second husband for approximately 2 months.  They do not have  intercourse as he has ED and lung cancer.   PAST SURGICAL HISTORY:  TAH in 1981 for benign reasons, appendectomy  with her first C-section followed by another C-section.  She has had  right knee and thigh surgeries related to her polio and she had a left  hammertoe surgery.   ALLERGIES:  No to LATEX allergies.   DRUG ALLERGIES:  PENICILLIN and SULFA.   FAMILY HISTORY:  Negative for breast, GYN, and colon malignancy.  Her  mother has dementia at age 25 and her mother also was diagnosed with  osteoporosis.   MEDICATIONS:  1. Xanax 0.5 mg as prescribed t.i.d. she takes it b.i.d.  2. Amlodipine, she takes 10 mg.  3. Etodolac 400 mg.  4. Omeprazole 20 mg.  5. She has been on Premarin 0.625 mg daily for more than 11  years.  6. Vitamin D 50,000 units.  7. Meclizine 25 mg.  8. Propoxyphene.  9. Multivitamins.  10.Vitamin E 400 units.  11.Omega-3 daily.   PHYSICAL EXAMINATION:  GENERAL:  Very pleasant, well-nourished, well-  hydrated, white female with no apparent distress.  VITAL SIGNS:  Height 5 feet 3 inch, weight 159 pounds, blood pressure  140/90, pulse 74.  HEENT:  Normal.  HEART:  Regular rate and rhythm.  BREASTS:  Normal bilaterally.  No nipple changes, nipple discharge, skin  changes, or masses.  ABDOMEN:  Benign scaphoid.  No hepatosplenomegaly.  EXTERNAL GENITALIA:  Moderate atrophy.  No lesions.  Cervix nulliparous.  No lesions.  BIMANUAL:  A mid plane, non mobile uterus that is normal size and shape.  Her adnexa are not palpable.   ASSESSMENT AND PLAN:  Annual exam.  I have checked a Pap smear due to  her  history of abnormal.  We will schedule her for a bone density scan  and recommended self-breast exams monthly.      Allie Bossier, MD     MCD/MEDQ  D:  02/26/2009  T:  02/27/2009  Job:  [id number]

## 2011-03-30 ENCOUNTER — Ambulatory Visit: Payer: Self-pay

## 2011-03-30 ENCOUNTER — Ambulatory Visit (INDEPENDENT_AMBULATORY_CARE_PROVIDER_SITE_OTHER): Payer: Medicare Other | Admitting: Obstetrics & Gynecology

## 2011-03-30 DIAGNOSIS — Z01419 Encounter for gynecological examination (general) (routine) without abnormal findings: Secondary | ICD-10-CM

## 2011-04-06 ENCOUNTER — Ambulatory Visit
Admission: RE | Admit: 2011-04-06 | Discharge: 2011-04-06 | Disposition: A | Discharge status: Home / Self Care | Payer: Medicare Other | Source: Ambulatory Visit | Attending: Obstetrics & Gynecology | Admitting: Obstetrics & Gynecology

## 2011-04-06 DIAGNOSIS — Z1231 Encounter for screening mammogram for malignant neoplasm of breast: Secondary | ICD-10-CM

## 2011-04-08 ENCOUNTER — Telehealth: Payer: Self-pay | Admitting: Pulmonary Disease

## 2011-04-08 NOTE — Telephone Encounter (Signed)
Spoke with pt.  States she has her yearly check up with SN in Dec.  States this past year her LDL was elevated and SN rec she start on meds.  Pt states she did not want to do this and wanted to try to get this lower by herself.  States she has started taking red yeast rice and dieting.  Has lost 20-25lbs.  Would like to be seen before Dec for check up on everything but has questions regarding insurance.  Advised will work on this and will call her back.  She is ok with this.

## 2011-04-08 NOTE — Telephone Encounter (Signed)
Spoke with TD regarding this.  Agreed to add pt to SN's schedule before Dec so pt can discuss her chol with SN.    Called, spoke with pt.  She was informed she will be added to SN's schedule to discuss her chol levels since she did not start the meds but have been dieting and taking red yeast rice and was informed to explain this to SN.  She verbalized understanding of this.  She is requesting OV in September -- scheduled for Sept 5 at 10:30am -- pt aware.

## 2011-04-27 ENCOUNTER — Other Ambulatory Visit: Payer: Self-pay | Admitting: *Deleted

## 2011-04-27 MED ORDER — ALPRAZOLAM 0.5 MG PO TABS: 0.5000 mg | ORAL_TABLET | Freq: Three times a day (TID) | ORAL | Status: AC | PRN

## 2011-04-29 NOTE — Assessment & Plan Note (Signed)
NAME:  Veronica Strong, Veronica Strong                 ACCOUNT NO.:  [id number]  MEDICAL RECORD NO.:  [id number]            PATIENT TYPE:  LOCATION:  [facility] [place]           FACILITY:  PHYSICIAN:  Allie Bossier, MD             DATE OF BIRTH:  DATE OF SERVICE:  03/30/2011                                 CLINIC NOTE  HISTORY OF PRESENT ILLNESS:  Ms. Valvano is a 65 year old widowed white G2, P1, A1.  She comes in here for annual exam.  She has no complaints and in fact she is very pleased because she has successfully lost 23 pounds.  She has a new boyfriend with whom she is having satisfactory intercourse.  Her past medical history is unchanged.  She is status post polio at age 61 and has a brace on her right leg, she has hypertension, history of anxiety history of abnormal Pap smear back in 2008, colon polyps, fibromyalgia and known low bone mass.  REVIEW OF SYSTEMS:  She is retired Dipl[employer] Services operational officer.  She has been on Premarin for more than 10 years when she auto-discontinued it in 2010.  She is doing well except for an occasional hot flash.  She denies vaginal dryness.  Her last bone density was 2011 at that time which showed some low bone mass at that time I have recommended Fosamax and she begin Fosamax and told her we will check her DEXA scan again next year (2013).  PAST SURGICAL HISTORY:  She had a TAH in 1981 for benign reasons.  She had appendectomy with her C-section at which showed vision appendectomy with one of her two C-sections.  She had a right knee and thigh surgery and left hammertoe surgery medicine.  ALLERGIES:  PENICILLIN and SULFA.  No latex allergies.  FAMILY HISTORY:  Negative for breast, GYN, colon malignancy but positive for osteoporosis and dementia.  MEDICATIONS:  Xanax 5 mg b.i.d./t.i.d. p.r.n., amlodipine 10 mg, etodolac 400 mg, omeprazole 20 mg, meclizine 25 mg, multivitamin, vitamin E, Omega-3, vitamin B12 vitamin D, calcium 600 mg b.i.d., Fosamax 70 mg weekly and red  rice yeast daily.  PHYSICAL EXAMINATION:  GENERAL/VITAL SIGNS:  Physical exam, well- nourished, well-hydrated very pleasant white female, height 530, weight 140, blood pressure 121/69, pulse 81. HEENT:  Normal. BREAST:  Normal bilaterally. HEART:  Regular rate and rhythm. LUNGS:  Clear to auscultation bilaterally. ABDOMEN:  Benign. EXTERNAL GENITALIA:  Closely trimmed, no lesions.  Moderate atrophy. Vaginal cuff has no lesions, just atrophic.  Bimanual exam reveals no masses or tenderness.  ASSESSMENT/PLAN:  Annual exam.  Recommended self-breast and self-vulvar exams.  I have recommended that we recheck her bone density test next year and that she continue current medications.     Allie Bossier, MD    MCD/MEDQ  D:  03/30/2011  T:  03/31/2011  Job:  [id number]

## 2011-06-23 ENCOUNTER — Other Ambulatory Visit: Payer: Self-pay | Admitting: Pulmonary Disease

## 2011-06-23 ENCOUNTER — Encounter: Payer: Self-pay | Admitting: Pulmonary Disease

## 2011-06-23 ENCOUNTER — Ambulatory Visit (INDEPENDENT_AMBULATORY_CARE_PROVIDER_SITE_OTHER): Payer: Medicare Other | Admitting: Pulmonary Disease

## 2011-06-23 ENCOUNTER — Other Ambulatory Visit (INDEPENDENT_AMBULATORY_CARE_PROVIDER_SITE_OTHER): Payer: Medicare Other

## 2011-06-23 DIAGNOSIS — F411 Generalized anxiety disorder: Secondary | ICD-10-CM

## 2011-06-23 DIAGNOSIS — I10 Essential (primary) hypertension: Secondary | ICD-10-CM

## 2011-06-23 DIAGNOSIS — E559 Vitamin D deficiency, unspecified: Secondary | ICD-10-CM

## 2011-06-23 DIAGNOSIS — K219 Gastro-esophageal reflux disease without esophagitis: Secondary | ICD-10-CM

## 2011-06-23 DIAGNOSIS — M8580 Other specified disorders of bone density and structure, unspecified site: Secondary | ICD-10-CM | POA: Insufficient documentation

## 2011-06-23 DIAGNOSIS — IMO0001 Reserved for inherently not codable concepts without codable children: Secondary | ICD-10-CM

## 2011-06-23 DIAGNOSIS — Z23 Encounter for immunization: Secondary | ICD-10-CM

## 2011-06-23 DIAGNOSIS — M810 Age-related osteoporosis without current pathological fracture: Secondary | ICD-10-CM

## 2011-06-23 DIAGNOSIS — E7800 Pure hypercholesterolemia, unspecified: Secondary | ICD-10-CM

## 2011-06-23 DIAGNOSIS — A809 Acute poliomyelitis, unspecified: Secondary | ICD-10-CM

## 2011-06-23 DIAGNOSIS — D126 Benign neoplasm of colon, unspecified: Secondary | ICD-10-CM

## 2011-06-23 DIAGNOSIS — M159 Polyosteoarthritis, unspecified: Secondary | ICD-10-CM

## 2011-06-23 LAB — URINALYSIS, ROUTINE W REFLEX MICROSCOPIC
Hgb urine dipstick: NEGATIVE
Ketones, ur: NEGATIVE
Nitrite: NEGATIVE
Urobilinogen, UA: 0.2 (ref 0.0–1.0)
pH: 6 (ref 5.0–8.0)

## 2011-06-23 LAB — CBC WITH DIFFERENTIAL/PLATELET
Basophils Relative: 0.5 % (ref 0.0–3.0)
Eosinophils Relative: 3.1 % (ref 0.0–5.0)
HCT: 32.6 % — ABNORMAL LOW (ref 36.0–46.0)
Hemoglobin: 10.7 g/dL — ABNORMAL LOW (ref 12.0–15.0)
Lymphs Abs: 2.5 10*3/uL (ref 0.7–4.0)
MCV: 92 fl (ref 78.0–100.0)
Monocytes Absolute: 0.4 10*3/uL (ref 0.1–1.0)
Monocytes Relative: 7 % (ref 3.0–12.0)
Neutro Abs: 2.4 10*3/uL (ref 1.4–7.7)
RBC: 3.54 Mil/uL — ABNORMAL LOW (ref 3.87–5.11)
WBC: 5.5 10*3/uL (ref 4.5–10.5)

## 2011-06-23 LAB — TSH: TSH: 0.94 u[IU]/mL (ref 0.35–5.50)

## 2011-06-23 LAB — HEPATIC FUNCTION PANEL
AST: 21 U/L (ref 0–37)
Albumin: 4 g/dL (ref 3.5–5.2)
Total Bilirubin: 0.5 mg/dL (ref 0.3–1.2)

## 2011-06-23 LAB — BASIC METABOLIC PANEL
BUN: 13 mg/dL (ref 6–23)
Calcium: 9.1 mg/dL (ref 8.4–10.5)
GFR: 122.79 mL/min (ref >60.00)
Glucose, Bld: 92 mg/dL (ref 70–99)
Potassium: 3.7 mEq/L (ref 3.5–5.1)

## 2011-06-23 LAB — LIPID PANEL
HDL: 118.3 mg/dL (ref >39.00)
Triglycerides: 63 mg/dL (ref 0.0–149.0)
VLDL: 12.6 mg/dL (ref 0.0–40.0)

## 2011-06-23 NOTE — Patient Instructions (Signed)
Today we updated your med list in EPIC...    Continue your current meds the same...  Today we did your follow up fasting blood work...    Please call the [facility] in a few days for your results...    Dial [id number] & when prompted enter your patient number followed by the # symbol...    Your patient number is:  [id number]#  Great job w/ diet & exercise...  Call for any problems...  Let's plan a follow up visit in 1 year, sooner if needed for problems.Marland KitchenMarland Kitchen

## 2011-06-23 NOTE — Progress Notes (Signed)
Subjective:    Patient ID: Veronica Strong, female    DOB: 06/03/1946, 65 y.o.   MRN: [id number]  HPI 65 y/o WF here for a yearly follow up visit... he has multiple medical problems as noted below...    ~  October 09, 2009:  she is under considerable stress- new husb, [name], is dying w/ lung cancer & liver mets, Rx at [facility]- on Hospice now... meds refilled- see problem list below.  ~  October 13, 2010:  Yearly ROV & f/u HBP, Chol, etc... she reports much stress w/ death of her second husb & several family members this yr... medically OK w/ 13# weight reduction on diet, BP stable, trying to control Chol w/ diet alone + KrillOil now (refuses statin rx)... she sees DrDove in [place] GYN w/ BMD 25/11 showing Osteopenia & started on Alendronate...  she had seasonal flu vaccine already, and due to TDAP today (she will turn 59 soon & get Pneumovax in 2012)... wants 90d refill meds today.  ~  June 23, 2011:  54mo ROV & she tells me she's been working on her diet & Chol, wt down 7# to 139# today, & fasting labs show sl improvement w/ this & RYR, she is encouraged & wants to continue diet management even though she is not yet at the goal...    >BP looks good on Amlodipine monotherapy & measures 132/74 today w/o CP, palpit, syncope, SOB, edema; exercise is limited by her hx polio & leg brace...    >GI is stable on Omep20 & she is due for f/u colonoscopy soon per DrPatterson...    >She has DJD & Osteopenia w/ BMD -1.9 last yr & she is on Fosamax, calcium, MVI, Vit D 2000u daily...    >Anxiety managed w/ Alpraz prn...           Problem List:   HYPERTENSION (ICD-401.9) - on ASA 81mg /d,  NORVASC 10mg /d... BP= 132/74 & feeling well, takes med regularly, tolerated well... denies visual changes, CP, palipit, dizziness, syncope, dyspnea, edema, etc... ~  12/11:  great job on diet & wt down 13# to 147# today, BP= 130/72 doing well. ~  CXR 12/11 showed normal cor, clear  lungs...  HYPERCHOLESTEROLEMIA (ICD-272.0) - on Krill Oil + diet & added RYR... she has very hi HDL's... refuses statin rx. ~  despite TChol in the 280-300 range & LDL in the 120-140 range, her HDL is in the 120-140 range, and she has chosen NOT to take statin med to bring the Total and LDL down... ~  FLP 10/08 showed TChol 287, TG 102, HDL 125, LDL 138 ~  FLP 10/09 shows TChol 281, TG 81, HDL 118, LDL 113 ~  FLP 12/10 showed TChol 290, TG 51, HDL 120, LDL 147... offered low dose Statin, she prefers diet. ~  FLP 12/11 showed TChol 278, TG 85, HDL 86, LDL 157... refuses even low dose statin rx. ~  FLP 9/12 showed TChol 254, TG 63, HDL 118, LDL 118... Improved w/ diet & RYR- continue same...  GERD (ICD-530.81) - on OMEPRAZOLE 20mg /d... she had an EGD in 1999 w/ 4-5cmHH and mod reflux...  COLONIC POLYPS (ICD-211.3) - had f/u colonoscopy by DrPatterson on 07/10/08- sigmiod polyp removed= tubular adenoma;  also had abn in cecum (granularity/ erosions) w/ bx showing frag of tubular adenoma w/ high grade dysplasia... repeat colonoscopy 07/31/08 showed nodular folds in the cecum but bx was neg= colonic mucosa w/ adenomatous change... he plans f/u in 26yrs.  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 she fell at home in 1993 and fractured her right prox tibia and metatarsals in her foot... ~  she has a brace on her right leg due to hx polio...  ? of FIBROMYALGIA (ICD-729.1) - she has some fatigue & trigger points in the past...  OSTEOPENIA >> TScore 5/11 measured in forearm was -1.9 & she restarted FOSAMAX 70mg /wk... VITAMIN D DEFICIENCY (ICD-268.9) - on Vit D 50K for 61yr then switched to 1000 u OTC daily... ~  labs 10/09 showed Vit D level = 21... rec> Vit D 50K weekly... ~  labd 12/10 showed Vit D level = 43... rec> switch to 2000 u daily.  Hx of POLIOMYELITIS (ICD-045.90) - she has atrophy of muscles in right leg and uses a long leg brace per [facility]... DrSue  wrote her out of work on disability in 1999...  HEADACHE (ICD-784.0) - she uses ANTIVERT for dizziness Prn & OTC pain meds Prn.  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  Health Maintenance - GYN= DrDove in [place] on Alendronate, +Calcium, +MVI, +VitD... she is up to date on vaccinations:  gets the yearly seasonal flu vaccine;  given TDAP here 12/11;  due for Pneumovax at age 76 in 2012...   Past Surgical History  Procedure Date  . Orthopedic surgery related to her polio as a child   . Abdominal hysterectomy     Outpatient Encounter Prescriptions as of 06/23/2011  Medication Sig Dispense Refill  . alendronate (FOSAMAX) 70 MG tablet Take 70 mg by mouth every 7 (seven) days. Take with a full glass of water on an empty stomach.       . ALPRAZolam (XANAX) 0.5 MG tablet Take 0.5 mg by mouth 3 (three) times daily as needed. As needed for nerves       . amLODipine (NORVASC) 10 MG tablet Take 10 mg by mouth daily.        . Ascorbic Acid (VITAMIN C) 1000 MG tablet Take 1,000 mg by mouth daily.        Marland Kitchen aspirin 81 MG tablet Take 81 mg by mouth daily.        Marland Kitchen b complex vitamins tablet Take 1 tablet by mouth daily.        . Black Cohosh 540 MG CAPS Take 1 capsule by mouth daily.        . Calcium Carbonate-Vitamin D (CALCIUM + D) 600-200 MG-UNIT TABS Take 1 tablet by mouth daily.        . Cholecalciferol (VITAMIN D) 2000 UNITS CAPS Take 1 capsule by mouth daily.        . diphenhydramine-acetaminophen (TYLENOL PM) 25-500 MG TABS Take 2 tablets by mouth at bedtime as needed.        . etodolac (LODINE) 400 MG tablet Take 400 mg by mouth 2 (two) times daily. As needed for arthritis pain       . meclizine (ANTIVERT) 25 MG tablet Take 25 mg by mouth 3 (three) times daily as needed. As needed for dizziness       . OMEGA-3 KRILL OIL 300 MG CAPS Take 1 capsule by mouth daily.        Marland Kitchen omeprazole (PRILOSEC) 20 MG capsule Take 20 mg by mouth daily.        . Red Yeast Rice 600  MG CAPS Take 1 capsule by mouth 2 (two) times daily.        Marland Kitchen St  Johns Wort 300 MG CAPS Take 1 capsule by mouth 2 (two) times daily.        . vitamin B-12 (CYANOCOBALAMIN) 1000 MCG tablet Take 1,000 mcg by mouth daily.        . vitamin E 400 UNIT capsule Take 400 Units by mouth daily.        Marland Kitchen DISCONTD: Multiple Vitamins-Minerals (MULTIVITAMIN & MINERAL PO) Take 1 tablet by mouth daily.          Allergies  Allergen Reactions  . Penicillins   . Sulfonamide Derivatives     Current Medications, Allergies, Past Medical History, Past Surgical History, Family History, and Social History were reviewed in [employer] record.    Review of Systems        See HPI - all other systems neg except as noted...  The patient denies anorexia, fever, weight loss, weight gain, vision loss, decreased hearing, hoarseness, chest pain, syncope, dyspnea on exertion, peripheral edema, prolonged cough, headaches, hemoptysis, abdominal pain, melena, hematochezia, severe indigestion/heartburn, hematuria, incontinence, muscle weakness, suspicious skin lesions, transient blindness, difficulty walking, depression, unusual weight change, abnormal bleeding, enlarged lymph nodes, and angioedema.   Objective:   Physical Exam    WD, WN, 65 y/o WF in NAD... GENERAL:  Alert & oriented; pleasant & cooperative... HEENT:  /AT, EOM-wnl, PERRLA, EACs-clear, TMs-wnl, NOSE-clear, THROAT-clear & wnl. NECK:  Supple w/ fairROM; no JVD; normal carotid impulses w/o bruits; no thyromegaly or nodules palpated; no lymphadenopathy. CHEST:  Clear to P & A; without wheezes/ rales/ or rhonchi heard... HEART:  Regular Rhythm; without murmurs/ rubs/ or gallops detected... ABDOMEN:  Soft & nontender; normal bowel sounds; no organomegaly or masses palpated... EXT:  hx polio right leg w/ musc arophy, shorter, long leg brace in place,  mild arthritic changes;  no varicose veins/ sl venous insuffic/ tr edema.... NEURO:   CN's intact; focal weakness in right leg from old polio, sensory testing normal; gait abnormal & balance fair. DERM:  No lesions noted; no rash etc...   Assessment & Plan:   HBP>  Controlled on Amlodipine monotherapy + diet & reminded of no salt etc...  CHOL>  Her TChol & LDL are not at goal but her HDL is very hi & todays labs are improved over prev w/ diet & RYR added; continue same...  GI> GERD, Polyps>  She is due for f/u colon by DrPatterson soon & she will call to set this up...  Hx DJD & FM>  Aware, she uses Etodolac, Tylenol, etc...  Osteopenia/ Vit D Defic>  On Alendronate, calcium, MVI, Vit D...  Hx Polio>  Stable, she has leg brace & encouraged to get plenty of exercise...  Anxiety>  On Alprazolam for prn use.Marland KitchenMarland Kitchen

## 2011-06-29 ENCOUNTER — Other Ambulatory Visit: Payer: Self-pay | Admitting: *Deleted

## 2011-06-29 ENCOUNTER — Encounter: Payer: Self-pay | Admitting: Pulmonary Disease

## 2011-06-29 MED ORDER — CIPROFLOXACIN HCL 250 MG PO TABS: 250.0000 mg | ORAL_TABLET | Freq: Two times a day (BID) | ORAL | Status: DC

## 2011-08-16 ENCOUNTER — Ambulatory Visit (INDEPENDENT_AMBULATORY_CARE_PROVIDER_SITE_OTHER): Payer: Medicare Other | Admitting: *Deleted

## 2011-08-16 ENCOUNTER — Other Ambulatory Visit: Payer: Self-pay | Admitting: *Deleted

## 2011-08-16 VITALS — Ht 64.0 in | Wt 138.0 lb

## 2011-08-16 DIAGNOSIS — Z86010 Personal history of colonic polyps: Secondary | ICD-10-CM

## 2011-08-16 MED ORDER — ALENDRONATE SODIUM 70 MG PO TABS: 70.0000 mg | ORAL_TABLET | ORAL | Status: DC

## 2011-08-16 MED ORDER — PEG-KCL-NACL-NASULF-NA ASC-C 100 G PO SOLR: ORAL | Status: DC

## 2011-08-16 NOTE — Progress Notes (Signed)
Patient states she has done fine during last colonoscopies,had no problems with sedation. She did not want to use propofol & change date.

## 2011-08-27 ENCOUNTER — Other Ambulatory Visit (INDEPENDENT_AMBULATORY_CARE_PROVIDER_SITE_OTHER): Payer: Medicare Other

## 2011-08-27 ENCOUNTER — Other Ambulatory Visit: Payer: Self-pay | Admitting: Gastroenterology

## 2011-08-27 ENCOUNTER — Encounter: Payer: Self-pay | Admitting: Gastroenterology

## 2011-08-27 ENCOUNTER — Ambulatory Visit (AMBULATORY_SURGERY_CENTER): Payer: Medicare Other | Admitting: Gastroenterology

## 2011-08-27 DIAGNOSIS — D126 Benign neoplasm of colon, unspecified: Secondary | ICD-10-CM

## 2011-08-27 DIAGNOSIS — D649 Anemia, unspecified: Secondary | ICD-10-CM

## 2011-08-27 DIAGNOSIS — Z86010 Personal history of colonic polyps: Secondary | ICD-10-CM

## 2011-08-27 DIAGNOSIS — Z1211 Encounter for screening for malignant neoplasm of colon: Secondary | ICD-10-CM

## 2011-08-27 LAB — CBC WITH DIFFERENTIAL/PLATELET
Basophils Absolute: 0 10*3/uL (ref 0.0–0.1)
Basophils Relative: 0.4 % (ref 0.0–3.0)
Hemoglobin: 10.6 g/dL — ABNORMAL LOW (ref 12.0–15.0)
Lymphocytes Relative: 49.8 % — ABNORMAL HIGH (ref 12.0–46.0)
Monocytes Relative: 7 % (ref 3.0–12.0)
Neutro Abs: 2.4 10*3/uL (ref 1.4–7.7)
Neutrophils Relative %: 40.7 % — ABNORMAL LOW (ref 43.0–77.0)
RBC: 3.64 Mil/uL — ABNORMAL LOW (ref 3.87–5.11)

## 2011-08-27 MED ORDER — SODIUM CHLORIDE 0.9 % IV SOLN: 500.0000 mL | INTRAVENOUS | Status: DC

## 2011-08-27 NOTE — Patient Instructions (Signed)
Please go to basement today for CBC blood work before discharged home. Please follow discharge instructions given today. See handouts. Colon polyps x2 removed today and sent to lab. You will receive result letter in your mail in 1-2 weeks. Repeat colonoscopy in 3 years. Resume current medications. Call us with any questions or concerns [phone].  Thank you!

## 2011-08-30 ENCOUNTER — Telehealth: Payer: Self-pay

## 2011-08-30 NOTE — Telephone Encounter (Signed)
Follow up Call- Patient questions:  Do you have a fever, pain , or abdominal swelling? no Pain Score  0 *  Have you tolerated food without any problems? yes  Have you been able to return to your normal activities? yes  Do you have any questions about your discharge instructions: Diet   no Medications  no Follow up visit  no  Do you have questions or concerns about your Care? no  Actions: * If pain score is 4 or above: No action needed, pain <4.  Per the pt "I'm okay". Maw

## 2011-08-31 ENCOUNTER — Encounter: Payer: Self-pay | Admitting: Gastroenterology

## 2011-08-31 ENCOUNTER — Telehealth: Payer: Self-pay | Admitting: Pulmonary Disease

## 2011-08-31 DIAGNOSIS — D649 Anemia, unspecified: Secondary | ICD-10-CM

## 2011-08-31 DIAGNOSIS — E538 Deficiency of other specified B group vitamins: Secondary | ICD-10-CM

## 2011-08-31 NOTE — Telephone Encounter (Signed)
[email or web address]  

## 2011-08-31 NOTE — Telephone Encounter (Signed)
Spoke with pt. She states that when she had colonoscopy with Dr. Jarold Motto on 08/27/11, she had CBC drawn per [facility] request. She is now calling for those results. Please advise, thanks!

## 2011-08-31 NOTE — Telephone Encounter (Signed)
Pt has returned Veronica Strong call.  [name]'

## 2011-09-01 ENCOUNTER — Telehealth: Payer: Self-pay | Admitting: Pulmonary Disease

## 2011-09-01 DIAGNOSIS — D649 Anemia, unspecified: Secondary | ICD-10-CM | POA: Insufficient documentation

## 2011-09-01 NOTE — Telephone Encounter (Signed)
Per SN--still anemic but hg is stable at 10.6.  Will need further eval labs---fe/tibc, b12,folate, spe with reflex iep.  These labs have been placed in the computer.  Pt is aware of the lab results and will return to the lab on 11/15 for these repeat labs.

## 2011-09-01 NOTE — Telephone Encounter (Signed)
Called and spoke with pt and she wanted to have her labs done at the [place] office but she is not sure if they even have a lab there.  i explained to her that i could find out but it would be tomorrow before i could find this out---pt stated that she will just come here  For her labs tomorrow.

## 2011-09-02 ENCOUNTER — Other Ambulatory Visit (INDEPENDENT_AMBULATORY_CARE_PROVIDER_SITE_OTHER): Payer: Medicare Other

## 2011-09-02 ENCOUNTER — Other Ambulatory Visit: Payer: Self-pay | Admitting: Pulmonary Disease

## 2011-09-02 DIAGNOSIS — E538 Deficiency of other specified B group vitamins: Secondary | ICD-10-CM

## 2011-09-02 DIAGNOSIS — D649 Anemia, unspecified: Secondary | ICD-10-CM

## 2011-09-02 LAB — IBC PANEL
Iron: 54 ug/dL (ref 42–145)
Saturation Ratios: 13.9 % — ABNORMAL LOW (ref 20.0–50.0)

## 2011-09-03 LAB — FOLATE: Folate: >24.8 ng/mL (ref >5.9)

## 2011-09-06 LAB — PROTEIN ELECTROPHORESIS, SERUM, WITH REFLEX
Alpha-1-Globulin: 4.4 % (ref 2.9–4.9)
Alpha-2-Globulin: 11.1 % (ref 7.1–11.8)
Gamma Globulin: 13.6 % (ref 11.1–18.8)
Total Protein, Serum Electrophoresis: 7.3 g/dL (ref 6.0–8.3)

## 2011-09-06 LAB — VITAMIN B12: Vitamin B-12: >1500 pg/mL — ABNORMAL HIGH (ref 211–911)

## 2011-11-10 ENCOUNTER — Telehealth: Payer: Self-pay | Admitting: Pulmonary Disease

## 2011-11-10 MED ORDER — AMLODIPINE BESYLATE 10 MG PO TABS: 10.0000 mg | ORAL_TABLET | Freq: Every day | ORAL | Status: DC

## 2011-11-10 MED ORDER — OMEPRAZOLE 20 MG PO CPDR: 20.0000 mg | DELAYED_RELEASE_CAPSULE | Freq: Every day | ORAL | Status: DC

## 2011-11-10 MED ORDER — ETODOLAC 400 MG PO TABS: 400.0000 mg | ORAL_TABLET | Freq: Two times a day (BID) | ORAL | Status: DC

## 2011-11-10 NOTE — Telephone Encounter (Signed)
Rx has been sent for etodolac, omeprazole, and amlodipine to [facility]. Pt is aware and nothing further was needed

## 2011-11-29 ENCOUNTER — Encounter: Payer: Self-pay | Admitting: Pulmonary Disease

## 2011-11-29 ENCOUNTER — Ambulatory Visit (INDEPENDENT_AMBULATORY_CARE_PROVIDER_SITE_OTHER): Payer: Medicare Other | Admitting: Pulmonary Disease

## 2011-11-29 ENCOUNTER — Telehealth: Payer: Self-pay | Admitting: Pulmonary Disease

## 2011-11-29 VITALS — BP 120/72 | HR 109 | Temp 97.7°F | Ht 63.0 in | Wt 140.2 lb

## 2011-11-29 DIAGNOSIS — D126 Benign neoplasm of colon, unspecified: Secondary | ICD-10-CM

## 2011-11-29 DIAGNOSIS — E559 Vitamin D deficiency, unspecified: Secondary | ICD-10-CM

## 2011-11-29 DIAGNOSIS — A809 Acute poliomyelitis, unspecified: Secondary | ICD-10-CM

## 2011-11-29 DIAGNOSIS — E7800 Pure hypercholesterolemia, unspecified: Secondary | ICD-10-CM

## 2011-11-29 DIAGNOSIS — M159 Polyosteoarthritis, unspecified: Secondary | ICD-10-CM

## 2011-11-29 DIAGNOSIS — K219 Gastro-esophageal reflux disease without esophagitis: Secondary | ICD-10-CM

## 2011-11-29 DIAGNOSIS — I10 Essential (primary) hypertension: Secondary | ICD-10-CM

## 2011-11-29 DIAGNOSIS — D649 Anemia, unspecified: Secondary | ICD-10-CM

## 2011-11-29 DIAGNOSIS — M810 Age-related osteoporosis without current pathological fracture: Secondary | ICD-10-CM

## 2011-11-29 DIAGNOSIS — IMO0001 Reserved for inherently not codable concepts without codable children: Secondary | ICD-10-CM

## 2011-11-29 NOTE — Progress Notes (Signed)
Subjective:    Patient ID: Veronica Strong, female    DOB: Nov 19, 1945, 66 y.o.   MRN: [id number]  HPI 66 y/o WF here for a yearly follow up visit... he has multiple medical problems as noted below...    ~  October 09, 2009:  she is under considerable stress- new husb, [name], is dying w/ lung cancer & liver mets, Rx at [facility]- on Hospice now... meds refilled- see problem list below.  ~  October 13, 2010:  Yearly ROV & f/u HBP, Chol, etc... she reports much stress w/ death of her second husb & several family members this yr... medically OK w/ 13# weight reduction on diet, BP stable, trying to control Chol w/ diet alone + KrillOil now (refuses statin rx)... she sees DrDove in [place] [facility] w/ BMD 25/11 showing Osteopenia & started on Alendronate...  she had seasonal flu vaccine already, and due to TDAP today (she will turn 66 soon & get Pneumovax in 2012)... wants 66d refill meds today.  ~  June 23, 2011:  20mo ROV & she tells me she's been working on her diet & Chol, wt down 7# to 139# today, & fasting labs show sl improvement w/ this & RYR, she is encouraged & wants to continue diet management even though she is not yet at the goal...    >BP looks good on Amlodipine monotherapy & measures 132/74 today w/o CP, palpit, syncope, SOB, edema; exercise is limited by her hx polio & leg brace...    >GI is stable on Omep20 & she is due for f/u colonoscopy soon per DrPatterson...    >She has DJD & Osteopenia w/ BMD -1.9 last yr & she is on Fosamax, calcium, MVI, Vit D 2000u daily...    >Anxiety managed w/ Alpraz prn...  ~  November 29, 2011:  59mo ROV & follow up per DrPatterson> she had routine colonoscopy 11/12 w/ 2 sessile adenomatous polyps removed- one on cecum, one in rectum; she was mildly anemic w/ Hg=10.6, MCV=88, Fe=54 (14%), SPE= wnl w/o Mspike, & B12/Folate= wnl... He placed her on FeSO4 325mg  daily & asked her to f/u w/ [place];  She reports feeling well, no new complaints or concerns; in fact  she donated blood at [facility] several weeks ago & her Hg was reported 13.4, on this basis she declines further blood work here today...          Problem List:   HYPERTENSION (ICD-401.9) - on ASA 81mg /d,  NORVASC 10mg /d... ~  12/11:  great job on diet & wt down 13# to 147# today, BP= 130/72 doing well. ~  CXR 12/11 showed normal cor, clear lungs... ~  9/12: BP= 132/74 & feeling well, takes med regularly, tolerated well... denies visual changes, CP, palipit, dizziness, syncope, dyspnea, edema, etc... ~  2/13: 0/72 & she remains asymptomatic...  HYPERCHOLESTEROLEMIA (ICD-272.0) - on Krill Oil + diet & added RYR... she has very hi HDL's... refuses statin rx. ~  despite TChol in the 280-300 range & LDL in the 120-140 range, her HDL is in the 120-140 range, and she has chosen NOT to take statin med to bring the Total and LDL down... ~  FLP 10/08 showed TChol 287, TG 102, HDL 125, LDL 138 ~  FLP 10/09 shows TChol 281, TG 81, HDL 118, LDL 113 ~  FLP 12/10 showed TChol 290, TG 51, HDL 120, LDL 147... offered low dose Statin, she prefers diet. ~  FLP 12/11 showed TChol 278, TG  85, HDL 86, LDL 157... refuses even low dose statin rx. ~  FLP 9/12 showed TChol 254, TG 63, HDL 118, LDL 118... Improved w/ diet & RYR- continue same...  GERD (ICD-530.81) - on OMEPRAZOLE 20mg /d... she had an EGD in 1999 w/ 4-5cmHH and mod reflux...  COLONIC POLYPS (ICD-211.3) - had f/u colonoscopy by DrPatterson on 07/10/08- sigmiod polyp removed= tubular adenoma;  also had abn in cecum (granularity/ erosions) w/ bx showing frag of tubular adenoma w/ high grade dysplasia... repeat colonoscopy 07/31/08 showed nodular folds in the cecum but bx was neg= colonic mucosa w/ adenomatous change... he plans f/u in 34yrs. ~  11/12:  She had f/u colonoscopy by [facility] w/ 2 sessile polyps removed- one in cecum & one in rectum; repeat planned in another 35yrs...  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 she fell at home in 1993 and fractured her right prox tibia and metatarsals in her foot... ~  she has a brace on her right leg due to hx polio...  ? of FIBROMYALGIA (ICD-729.1) - she has some fatigue & trigger points in the past...  OSTEOPENIA >> TScore 5/11 measured in forearm was -1.9 & she restarted FOSAMAX 70mg /wk... VITAMIN D DEFICIENCY (ICD-268.9) - on Vit D 50K for 11yr then switched to 1000 u OTC daily... ~  labs 10/09 showed Vit D level = 21... rec> Vit D 50K weekly... ~  Labs 12/10 showed Vit D level = 43... rec> switch to 2000 u daily.  Hx of POLIOMYELITIS (ICD-045.90) - she has atrophy of muscles in right leg and uses a long leg brace per [facility]... DrSue wrote her out of work on disability in 1999...  HEADACHE (ICD-784.0) - she uses ANTIVERT for dizziness Prn & OTC pain meds Prn.  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  ANEMIA >> on FeSO4 325mg /d + Vit C 500mg /d... ~  11/12: she was mildly anemic w/ Hg=10.6, MCV=88, Fe=54 (14%), SPE= wnl w/o Mspike, & B12/Folate= wnl... DrPatterson did Colonoscopy w/ 2 sessile adenomatous polyps removed... He placed her on FeSO4 325mg  daily & asked her to f/u w/ [place]... ~  2/13: She reports that she donated blood at the [facility] several weeks ago & her Hg was reported 13.4, on this basis she declines further blood work here today...  Health Maintenance - GYN= DrDove in [place] on Alendronate, +Calcium, +MVI, +VitD... she is up to date on vaccinations:  gets the yearly seasonal flu vaccine;  given TDAP here 12/11;  due for Pneumovax at age 30 in 2012...   Past Surgical History  Procedure Date  . Orthopedic surgery related to her polio as a child   . Abdominal hysterectomy   . Cesarean section     X2    Outpatient Encounter Prescriptions as of 11/29/2011  Medication Sig Dispense Refill  . alendronate (FOSAMAX) 70 MG tablet Take 1 tablet (70 mg total) by mouth every 7 (seven) days. Take with a  full glass of water on an empty stomach.  12 tablet  3  . ALPRAZolam (XANAX) 0.5 MG tablet Take 0.5 mg by mouth 3 (three) times daily as needed. As needed for nerves       . amLODipine (NORVASC) 10 MG tablet Take 1 tablet (10 mg total) by mouth daily.  90 tablet  3  . Ascorbic Acid (VITAMIN C) 1000 MG tablet Take 1,000 mg by mouth daily.        Marland Kitchen aspirin 81 MG  tablet Take 81 mg by mouth daily.        Marland Kitchen b complex vitamins tablet Take 1 tablet by mouth daily.        . Black Cohosh 540 MG CAPS Take 1 capsule by mouth daily.        . Calcium Carbonate-Vitamin D (CALCIUM + D) 600-200 MG-UNIT TABS Take 1 tablet by mouth daily.        . Cholecalciferol (VITAMIN D) 2000 UNITS CAPS Take 1 capsule by mouth daily.        . diphenhydramine-acetaminophen (TYLENOL PM) 25-500 MG TABS Take 2 tablets by mouth at bedtime as needed.        . etodolac (LODINE) 400 MG tablet Take 1 tablet (400 mg total) by mouth 2 (two) times daily. As needed for arthritis pain  180 tablet  3  . OMEGA-3 KRILL OIL 300 MG CAPS Take 1 capsule by mouth daily.        Marland Kitchen omeprazole (PRILOSEC) 20 MG capsule Take 1 capsule (20 mg total) by mouth daily.  90 capsule  3  . RASPBERRY PO Take 125 mg by mouth daily with breakfast. ketones       . Red Yeast Rice 600 MG CAPS Take 1 capsule by mouth 2 (two) times daily.        Satira Sark Johns Wort 300 MG CAPS Take 1 capsule by mouth 2 (two) times daily.        . vitamin E 400 UNIT capsule Take 400 Units by mouth daily.        Marland Kitchen DISCONTD: meclizine (ANTIVERT) 25 MG tablet Take 25 mg by mouth 3 (three) times daily as needed. As needed for dizziness       . DISCONTD: vitamin B-12 (CYANOCOBALAMIN) 1000 MCG tablet Take 1,000 mcg by mouth daily.          Allergies  Allergen Reactions  . Penicillins Hives  . Sulfonamide Derivatives Hives    Current Medications, Allergies, Past Medical History, Past Surgical History, Family History, and Social History were reviewed in [employer]  record.    Review of Systems        See HPI - all other systems neg except as noted...  The patient denies anorexia, fever, weight loss, weight gain, vision loss, decreased hearing, hoarseness, chest pain, syncope, dyspnea on exertion, peripheral edema, prolonged cough, headaches, hemoptysis, abdominal pain, melena, hematochezia, severe indigestion/heartburn, hematuria, incontinence, muscle weakness, suspicious skin lesions, transient blindness, difficulty walking, depression, unusual weight change, abnormal bleeding, enlarged lymph nodes, and angioedema.   Objective:   Physical Exam    WD, WN, 66 y/o WF in NAD... GENERAL:  Alert & oriented; pleasant & cooperative... HEENT:  [place]/AT, EOM-wnl, PERRLA, EACs-clear, TMs-wnl, NOSE-clear, THROAT-clear & wnl. NECK:  Supple w/ fairROM; no JVD; normal carotid impulses w/o bruits; no thyromegaly or nodules palpated; no lymphadenopathy. CHEST:  Clear to P & A; without wheezes/ rales/ or rhonchi heard... HEART:  Regular Rhythm; without murmurs/ rubs/ or gallops detected... ABDOMEN:  Soft & nontender; normal bowel sounds; no organomegaly or masses palpated... EXT:  hx polio right leg w/ musc arophy, shorter, long leg brace in place,  mild arthritic changes;  no varicose veins/ sl venous insuffic/ tr edema.... NEURO:  CN's intact; focal weakness in right leg from old polio, sensory testing normal; gait abnormal & balance fair. DERM:  No lesions noted; no rash etc...  RADIOLOGY DATA:  Reviewed in the EPIC EMR & discussed w/ the patient.Marland KitchenMarland Kitchen  LABORATORY DATA:  Reviewed in the EPIC EMR & discussed w/ the patient...   Assessment & Plan:   ANEMIA>  This has resolved on her Fe therapy &Hg was 13.4 recently at the [facility]...   HBP>  Controlled on Amlodipine monotherapy + diet & reminded of no salt etc...  CHOL>  Her TChol & LDL are not at goal but her HDL is very hi & todays labs are improved over prev w/ diet & RYR added; continue same...  GI> GERD,  Polyps>  Follow up colon [facility] 11/12 showed 2 sessile adenomatous polyps, f/u again in 3 yrs planned.  Hx DJD & FM>  Aware, she uses Etodolac, Tylenol, etc...  Osteopenia/ Vit D Defic>  On Alendronate, calcium, MVI, Vit D...  Hx Polio>  Stable, she has leg brace & encouraged to get plenty of exercise...  Anxiety>  On Alprazolam for prn use...   Patient's Medications  New Prescriptions   No medications on file  Previous Medications   ALENDRONATE (FOSAMAX) 70 MG TABLET    Take 1 tablet (70 mg total) by mouth every 7 (seven) days. Take with a full glass of water on an empty stomach.   ALPRAZOLAM (XANAX) 0.5 MG TABLET    Take 0.5 mg by mouth 3 (three) times daily as needed. As needed for nerves    AMLODIPINE (NORVASC) 10 MG TABLET    Take 1 tablet (10 mg total) by mouth daily.   ASCORBIC ACID (VITAMIN C) 1000 MG TABLET    Take 1,000 mg by mouth daily.     ASPIRIN 81 MG TABLET    Take 81 mg by mouth daily.     B COMPLEX VITAMINS TABLET    Take 1 tablet by mouth daily.     BLACK COHOSH 540 MG CAPS    Take 1 capsule by mouth daily.     CALCIUM CARBONATE-VITAMIN D (CALCIUM + D) 600-200 MG-UNIT TABS    Take 1 tablet by mouth daily.     CHOLECALCIFEROL (VITAMIN D) 2000 UNITS CAPS    Take 1 capsule by mouth daily.     DIPHENHYDRAMINE-ACETAMINOPHEN (TYLENOL PM) 25-500 MG TABS    Take 2 tablets by mouth at bedtime as needed.     ETODOLAC (LODINE) 400 MG TABLET    Take 1 tablet (400 mg total) by mouth 2 (two) times daily. As needed for arthritis pain   OMEGA-3 KRILL OIL 300 MG CAPS    Take 1 capsule by mouth daily.     OMEPRAZOLE (PRILOSEC) 20 MG CAPSULE    Take 1 capsule (20 mg total) by mouth daily.   RASPBERRY PO    Take 125 mg by mouth daily with breakfast. ketones    RED YEAST RICE 600 MG CAPS    Take 1 capsule by mouth 2 (two) times daily.     ST JOHNS WORT 300 MG CAPS    Take 1 capsule by mouth 2 (two) times daily.     VITAMIN E 400 UNIT CAPSULE    Take 400 Units by mouth daily.      Modified Medications   No medications on file  Discontinued Medications   MECLIZINE (ANTIVERT) 25 MG TABLET    Take 25 mg by mouth 3 (three) times daily as needed. As needed for dizziness    VITAMIN B-12 (CYANOCOBALAMIN) 1000 MCG TABLET    Take 1,000 mcg by mouth daily.

## 2011-11-29 NOTE — Telephone Encounter (Signed)
I spoke with pt and advised her of SN recs. She voiced her understanding and had no questions

## 2011-11-29 NOTE — Telephone Encounter (Signed)
Per SN---continue iron daily if still donating blood--ok to decrease iron tablet to every other day if she is not donating.  thanks

## 2011-11-29 NOTE — Patient Instructions (Signed)
Today we updated your med list in our EPIC system...    Continue your current medications the same...  Stay as active as possible...  Call for any questions...  Let's plan a follow up visit in the Autumn w/ FASTING blood work & a CXR.Marland KitchenMarland Kitchen

## 2011-11-29 NOTE — Telephone Encounter (Signed)
SN please advise if pt is to take the iron tablet daily or is it ok to take the iron tablets every other day?  Please advise. thanks

## 2012-01-19 ENCOUNTER — Other Ambulatory Visit: Payer: Self-pay | Admitting: *Deleted

## 2012-01-19 MED ORDER — ALPRAZOLAM 0.5 MG PO TABS: 0.5000 mg | ORAL_TABLET | Freq: Three times a day (TID) | ORAL | Status: DC | PRN

## 2012-01-19 NOTE — Telephone Encounter (Signed)
rx for the alprazolam has [facility]

## 2012-02-28 ENCOUNTER — Other Ambulatory Visit: Payer: Self-pay | Admitting: Obstetrics & Gynecology

## 2012-02-28 DIAGNOSIS — Z1231 Encounter for screening mammogram for malignant neoplasm of breast: Secondary | ICD-10-CM

## 2012-03-08 ENCOUNTER — Telehealth: Payer: Self-pay | Admitting: *Deleted

## 2012-04-11 ENCOUNTER — Ambulatory Visit
Admission: RE | Admit: 2012-04-11 | Discharge: 2012-04-11 | Disposition: A | Discharge status: Home / Self Care | Payer: Medicare Other | Source: Ambulatory Visit | Attending: Obstetrics & Gynecology | Admitting: Obstetrics & Gynecology

## 2012-04-11 ENCOUNTER — Ambulatory Visit: Payer: Medicare Other

## 2012-04-11 DIAGNOSIS — Z1231 Encounter for screening mammogram for malignant neoplasm of breast: Secondary | ICD-10-CM

## 2012-04-12 ENCOUNTER — Ambulatory Visit (INDEPENDENT_AMBULATORY_CARE_PROVIDER_SITE_OTHER): Payer: Medicare Other | Admitting: Obstetrics & Gynecology

## 2012-04-12 ENCOUNTER — Encounter: Payer: Self-pay | Admitting: Obstetrics & Gynecology

## 2012-04-12 VITALS — BP 153/79 | HR 80 | Temp 98.0°F | Resp 16 | Ht 63.75 in | Wt 140.0 lb

## 2012-04-12 DIAGNOSIS — Z0000 Encounter for general adult medical examination without abnormal findings: Secondary | ICD-10-CM

## 2012-04-12 NOTE — Progress Notes (Signed)
Subjective:    Veronica Strong is a 66 y.o. female who presents for an annual exam. The patient has no complaints today. The patient is not currently sexually active. GYN screening history: last pap: was normal. The patient wears seatbelts: yes. The patient participates in regular exercise: yes. Has the patient ever been transfused or tattooed?: no. The patient reports that there is not domestic violence in her life.   Menstrual History: OB History    Grav Para Term Preterm Abortions TAB SAB Ect Mult Living                  Menarche age: 66  No LMP recorded. Patient has had a hysterectomy.    The following portions of the patient's history were reviewed and updated as appropriate: allergies, current medications, past family history, past medical history, past social history, past surgical history and problem list.  Review of Systems A comprehensive review of systems was negative.  She has had a mammogram this month and her colonoscopy is UTD (had polyps removed).   Objective:    BP 153/79  Pulse 80  Temp 98 F (36.7 C) (Oral)  Resp 16  Ht 5' 3.75" (1.619 m)  Wt 140 lb (63.504 kg)  BMI 24.22 kg/m2  General Appearance:    Alert, cooperative, no distress, appears stated age  Head:    Normocephalic, without obvious abnormality, atraumatic  Eyes:    PERRL, conjunctiva/corneas clear, EOM's intact, fundi    benign, both eyes  Ears:    Normal TM's and external ear canals, both ears  Nose:   Nares normal, septum midline, mucosa normal, no drainage    or sinus tenderness  Throat:   Lips, mucosa, and tongue normal; teeth and gums normal  Neck:   Supple, symmetrical, trachea midline, no adenopathy;    thyroid:  no enlargement/tenderness/nodules; no carotid   bruit or JVD  Back:     Symmetric, no curvature, ROM normal, no CVA tenderness  Lungs:     Clear to auscultation bilaterally, respirations unlabored  Chest Wall:    No tenderness or deformity   Heart:    Regular rate and rhythm, S1  and S2 normal, no murmur, rub   or gallop  Breast Exam:    No tenderness, masses, or nipple abnormality  Abdomen:     Soft, non-tender, bowel sounds active all four quadrants,    no masses, no organomegaly  Genitalia:    Normal female without lesion, discharge or tenderness, severe atrophy, some symmetrical loss of melanin, at the introitus, there is a white plaque that I will need to biopsy at her next visit.     Extremities:   Extremities normal, atraumatic, no cyanosis or edema  Pulses:   2+ and symmetric all extremities  Skin:   Skin color, texture, turgor normal, no rashes or lesions  Lymph nodes:   Cervical, supraclavicular, and axillary nodes normal  Neurologic:   CNII-XII intact, normal strength, sensation and reflexes    throughout  .    Assessment:    Healthy female exam.    Plan:     schedule vuvlar biopsy

## 2012-04-12 NOTE — Progress Notes (Deleted)
  Subjective:    Patient ID: Veronica Strong, female    DOB: 11/23/1945, 66 y.o.   MRN: [id number]  HPI    Review of Systems     Objective:   Physical Exam        Assessment & Plan:

## 2012-04-17 ENCOUNTER — Telehealth: Payer: Self-pay | Admitting: Pulmonary Disease

## 2012-04-17 NOTE — Telephone Encounter (Signed)
LMTCB

## 2012-04-17 NOTE — Telephone Encounter (Signed)
Pt called back and she states she went to [facility] and is there now being treated. Nothing further needed. Pt will have them fax results. Carron Curie, CMA

## 2012-05-03 ENCOUNTER — Encounter: Payer: Self-pay | Admitting: Obstetrics & Gynecology

## 2012-05-03 ENCOUNTER — Ambulatory Visit (INDEPENDENT_AMBULATORY_CARE_PROVIDER_SITE_OTHER): Payer: Medicare Other | Admitting: Obstetrics & Gynecology

## 2012-05-03 ENCOUNTER — Other Ambulatory Visit: Payer: Self-pay | Admitting: *Deleted

## 2012-05-03 VITALS — BP 130/80 | HR 83 | Temp 98.0°F | Resp 16 | Ht 63.75 in | Wt 140.0 lb

## 2012-05-03 DIAGNOSIS — M810 Age-related osteoporosis without current pathological fracture: Secondary | ICD-10-CM

## 2012-05-03 DIAGNOSIS — M899 Disorder of bone, unspecified: Secondary | ICD-10-CM

## 2012-05-03 DIAGNOSIS — N9089 Other specified noninflammatory disorders of vulva and perineum: Secondary | ICD-10-CM

## 2012-05-03 DIAGNOSIS — M8580 Other specified disorders of bone density and structure, unspecified site: Secondary | ICD-10-CM

## 2012-05-03 MED ORDER — ALENDRONATE SODIUM 70 MG PO TABS: 70.0000 mg | ORAL_TABLET | ORAL | Status: DC

## 2012-05-03 NOTE — Progress Notes (Signed)
  Subjective:    Patient ID: Veronica Strong, female    DOB: 24-Apr-1946, 66 y.o.   MRN: [id number]  HPI  66 yo WW lady who was seen here last month for her annual exam. An asymptomatic white plaque was noted at the posterior vulva/introitus and she is here today for a vulvar biopsy.  Review of Systems     Objective:   Physical Exam  As above, sever atrophy noted  Area prepped with betadine after consent signed and time out done. 1 cc of 1% lidocaine used to infiltrate area.  3 mm punch biopsy used. Hemostasis achieved with silver nitrate. She tolerated the procedure well.      Assessment & Plan:   Vulvar lesion- await biopsy result.  RTC 1 year if lesion benign I have refilled her fosamax (DEXA essentially unchanged).

## 2012-05-05 ENCOUNTER — Telehealth: Payer: Self-pay | Admitting: *Deleted

## 2012-05-05 DIAGNOSIS — L900 Lichen sclerosus et atrophicus: Secondary | ICD-10-CM

## 2012-05-05 MED ORDER — CLOBETASOL PROPIONATE 0.05 % EX OINT: TOPICAL_OINTMENT | CUTANEOUS | Status: DC

## 2012-05-05 NOTE — Telephone Encounter (Signed)
LM on home phone that her biopsy showed Lichen Sclerosis and that a RX for Temovate was sent to her mail order pharmacy.  Pt to make appt in 6 months

## 2012-06-05 ENCOUNTER — Telehealth: Payer: Self-pay | Admitting: Pulmonary Disease

## 2012-06-05 MED ORDER — ISOMETHEPTENE-DICHLORAL-APAP 65-100-325 MG PO CAPS: 1.0000 | ORAL_CAPSULE | ORAL | Status: DC | PRN

## 2012-06-05 NOTE — Telephone Encounter (Signed)
Spoke to patient in regards to message and SN recs. Patient aware that medication called in to pharm.  Per SN: Phoned in Midrin 65-325-100mg  #25 1 po every 4 hours prn migraines. X [facility] [address] [place]

## 2012-06-05 NOTE — Telephone Encounter (Signed)
Spoke with pt. She states that she woke up this am feeling like she could not get focused and also had some blurred vision. She states that she was just seen this afternoon by her ophthalmologist and her eyes were fine, and she was advised that she had the beginning of a migraine HA and if it happens again to call PCP. She is feeling much better as of right now, and states no more blurred vision at all. She cancelled ov with TP for 06-07-12 and will keep planned appt with SN for 06/22/12 and call sooner if needed. Will forward to SN as FYI.

## 2012-06-06 ENCOUNTER — Telehealth: Payer: Self-pay | Admitting: Pulmonary Disease

## 2012-06-06 MED ORDER — TRAMADOL HCL 50 MG PO TABS: 50.0000 mg | ORAL_TABLET | Freq: Three times a day (TID) | ORAL | Status: AC | PRN

## 2012-06-06 NOTE — Telephone Encounter (Signed)
Called, spoke with pt who states the Midrin will be $75 for 25 tablets.  States pharm told her this is a generic rx.  Would like to know if there is anything that can be substituted that would be cheaper.  Dr. Kriste Basque, pls advise.  Thank you.

## 2012-06-06 NOTE — Telephone Encounter (Signed)
Called, spoke with pt.  I informed her of below per Dr. Kriste Basque.  She verbalized understanding of this and aware [facility] [place]. Advised to pls call back if anything further is needed.  She verbalized understanding of this and was very appreciative of this.

## 2012-06-06 NOTE — Telephone Encounter (Signed)
Per SN----pt can try the tramadol 50 mg  #90  1 po tid prn pain and can give 5 refills. thanks

## 2012-06-07 ENCOUNTER — Ambulatory Visit: Payer: Medicare Other | Admitting: Adult Health

## 2012-06-22 ENCOUNTER — Other Ambulatory Visit (INDEPENDENT_AMBULATORY_CARE_PROVIDER_SITE_OTHER): Payer: Medicare Other

## 2012-06-22 ENCOUNTER — Encounter: Payer: Self-pay | Admitting: Pulmonary Disease

## 2012-06-22 ENCOUNTER — Ambulatory Visit (INDEPENDENT_AMBULATORY_CARE_PROVIDER_SITE_OTHER): Payer: Medicare Other | Admitting: Pulmonary Disease

## 2012-06-22 VITALS — BP 124/60 | HR 72 | Temp 96.9°F | Ht 63.0 in | Wt 141.2 lb

## 2012-06-22 DIAGNOSIS — E7800 Pure hypercholesterolemia, unspecified: Secondary | ICD-10-CM

## 2012-06-22 DIAGNOSIS — M8580 Other specified disorders of bone density and structure, unspecified site: Secondary | ICD-10-CM

## 2012-06-22 DIAGNOSIS — I10 Essential (primary) hypertension: Secondary | ICD-10-CM

## 2012-06-22 DIAGNOSIS — F411 Generalized anxiety disorder: Secondary | ICD-10-CM

## 2012-06-22 DIAGNOSIS — D649 Anemia, unspecified: Secondary | ICD-10-CM

## 2012-06-22 DIAGNOSIS — IMO0001 Reserved for inherently not codable concepts without codable children: Secondary | ICD-10-CM

## 2012-06-22 DIAGNOSIS — M899 Disorder of bone, unspecified: Secondary | ICD-10-CM

## 2012-06-22 DIAGNOSIS — A809 Acute poliomyelitis, unspecified: Secondary | ICD-10-CM

## 2012-06-22 DIAGNOSIS — M159 Polyosteoarthritis, unspecified: Secondary | ICD-10-CM

## 2012-06-22 DIAGNOSIS — R51 Headache: Secondary | ICD-10-CM

## 2012-06-22 DIAGNOSIS — D126 Benign neoplasm of colon, unspecified: Secondary | ICD-10-CM

## 2012-06-22 DIAGNOSIS — K219 Gastro-esophageal reflux disease without esophagitis: Secondary | ICD-10-CM

## 2012-06-22 LAB — BASIC METABOLIC PANEL
Calcium: 9.5 mg/dL (ref 8.4–10.5)
Chloride: 104 mEq/L (ref 96–112)
Creatinine, Ser: 0.6 mg/dL (ref 0.4–1.2)

## 2012-06-22 LAB — LIPID PANEL
Cholesterol: 241 mg/dL — ABNORMAL HIGH (ref 0–200)
Total CHOL/HDL Ratio: 3
Triglycerides: 73 mg/dL (ref 0.0–149.0)
VLDL: 14.6 mg/dL (ref 0.0–40.0)

## 2012-06-22 LAB — HEPATIC FUNCTION PANEL
Bilirubin, Direct: 0.1 mg/dL (ref 0.0–0.3)
Total Bilirubin: 0.5 mg/dL (ref 0.3–1.2)

## 2012-06-22 LAB — CBC WITH DIFFERENTIAL/PLATELET
Basophils Relative: 0.4 % (ref 0.0–3.0)
Eosinophils Absolute: 0.1 10*3/uL (ref 0.0–0.7)
HCT: 40.5 % (ref 36.0–46.0)
Hemoglobin: 13.3 g/dL (ref 12.0–15.0)
MCHC: 32.8 g/dL (ref 30.0–36.0)
MCV: 96.9 fl (ref 78.0–100.0)
Monocytes Absolute: 0.4 10*3/uL (ref 0.1–1.0)
Neutro Abs: 3.3 10*3/uL (ref 1.4–7.7)
RBC: 4.17 Mil/uL (ref 3.87–5.11)

## 2012-06-22 NOTE — Patient Instructions (Addendum)
Today we updated your med list in our EPIC system...    Continue your current medications the same...  Today we did your follow up FASTING blood work...    We will call you w/ the results when avail...  Stay as active as poss...    Call for any questions...  Let's continue our 6 month check ups.Marland KitchenMarland Kitchen

## 2012-06-22 NOTE — Progress Notes (Signed)
Subjective:    Patient ID: Veronica Strong, female    DOB: 1946/06/21, 66 y.o.   MRN: [id number]  HPI 66 y/o WF here for a yearly follow up visit... he has multiple medical problems as noted below...    ~  October 09, 2009:  she is under considerable stress- new husb, [name], is dying w/ lung cancer & liver mets, Rx at [facility]- on Hospice now... meds refilled- see problem list below.  ~  October 13, 2010:  Yearly ROV & f/u HBP, Chol, etc... she reports much stress w/ death of her second husb & several family members this yr... medically OK w/ 13# weight reduction on diet, BP stable, trying to control Chol w/ diet alone + KrillOil now (refuses statin rx)... she sees DrDove in [place] GYN w/ BMD 25/11 showing Osteopenia & started on Alendronate...  she had seasonal flu vaccine already, and due to TDAP today (she will turn 66 soon & get Pneumovax in 2012)... wants 90d refill meds today.  ~  June 23, 2011:  66y.o. ROV & she tells me she's been working on her diet & Chol, wt down 7# to 139# today, & fasting labs show sl improvement w/ this & RYR, she is encouraged & wants to continue diet management even though she is not yet at the goal...    >BP looks good on Amlodipine monotherapy & measures 132/74 today w/o CP, palpit, syncope, SOB, edema; exercise is limited by her hx polio & leg brace...    >GI is stable on Omep20 & she is due for f/u colonoscopy soon per DrPatterson...    >She has DJD & Osteopenia w/ BMD -1.9 last yr & she is on Fosamax, calcium, MVI, Vit D 2000u daily...    >Anxiety managed w/ Alpraz prn...  ~  November 29, 2011:  66y.o. & follow up per DrPatterson> she had routine colonoscopy 11/12 w/ 2 sessile adenomatous polyps removed- one on cecum, one in rectum; she was mildly anemic w/ Hg=10.6, MCV=88, Fe=54 (14%), SPE= wnl w/o Mspike, & B12/Folate= wnl... He placed her on FeSO4 325mg  daily & asked her to f/u w/ [place];  She reports feeling well, no new complaints or concerns; in fact  she donated blood at [facility] several weeks ago & her Hg was reported 13.4, on this basis she declines further blood work here today...  ~  June 22, 2012:  66y.o. & Sriya is doing satis- no new complaints or concerns;  She describes some recent visual problem & after eval they felt it was an ocular migraine;  She is using Tramadol as needed for pain;  BP controlled on Norvasc & she denies CP, palpit, SOB, or edema; she been exercising by walking ~30mi/d; she is taking a number of supplements & feeling well...    We reviewed prob list, meds, xrays and labs> see below>> LABS 9/13:  FLP- not at goals but HDL=95;  Chems- wnl;  CBC- wnl;  TSH=1.40           Problem List:   HYPERTENSION (ICD-401.9) - on ASA 81mg /d,  NORVASC 10mg /d... ~  12/11:  great job on diet & wt down 13# to 147# today, BP= 130/72 doing well. ~  CXR 12/11 showed normal cor, clear lungs... ~  9/12:  BP= 132/74 & feeling well, takes med regularly, tolerated well... denies visual changes, CP, palipit, dizziness, syncope, dyspnea, edema, etc... ~  2/13:  BP= 120/72 & she remains asymptomatic... ~  9/13:  BP= 124/60 & she denies CP, palpit, SOB, edema...  HYPERCHOLESTEROLEMIA (ICD-272.0) - on Krill Oil + diet & added RYR... she has very hi HDL's... refuses statin rx. ~  despite TChol in the 280-300 range & LDL in the 120-140 range, her HDL is in the 120-140 range, and she has chosen NOT to take statin med to bring the Total and LDL down... ~  FLP 10/08 showed TChol 287, TG 102, HDL 125, LDL 138 ~  FLP 10/09 shows TChol 281, TG 81, HDL 118, LDL 113 ~  FLP 12/10 showed TChol 290, TG 51, HDL 120, LDL 147... offered low dose Statin, she prefers diet. ~  FLP 12/11 showed TChol 278, TG 85, HDL 86, LDL 157... refuses even low dose statin rx. ~  FLP 9/12 showed TChol 254, TG 63, HDL 118, LDL 118... Improved w/ diet & RYR- continue same... ~  FLP 9/13 showed TChol 241, TG 73, HDL 95, LDL 123  GERD (ICD-530.81) - on OMEPRAZOLE  20mg /d... she had an EGD in 1999 w/ 4-5cmHH and mod reflux...  COLONIC POLYPS (ICD-211.3) - had f/u colonoscopy by DrPatterson on 07/10/08- sigmiod polyp removed= tubular adenoma;  also had abn in cecum (granularity/ erosions) w/ bx showing frag of tubular adenoma w/ high grade dysplasia... repeat colonoscopy 07/31/08 showed nodular folds in the cecum but bx was neg= colonic mucosa w/ adenomatous change... he plans f/u in 1yrs. ~  11/12:  She had f/u colonoscopy by [facility] w/ 2 sessile polyps removed- one in cecum & one in rectum; repeat planned in another 70yrs...  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 she fell at home in 1993 and fractured her right prox tibia and metatarsals in her foot... ~  she has a brace on her right leg due to hx polio...  ? of FIBROMYALGIA (ICD-729.1) - on TRAMADOL 50mg  as needed; she has some fatigue & trigger points in the past...  OSTEOPENIA >> TScore 5/11 measured in forearm was -1.9 & she restarted FOSAMAX 70mg /wk... VITAMIN D DEFICIENCY (ICD-268.9) - on Vit D 50K for 15yr then switched to 1000 u OTC daily... ~  labs 10/09 showed Vit D level = 21... rec> Vit D 50K weekly... ~  Labs 12/10 showed Vit D level = 43... rec> switch to 2000 u daily.  Hx of POLIOMYELITIS (ICD-045.90) - she has atrophy of muscles in right leg and uses a long leg brace per [facility]... DrSue wrote her out of work on disability in 1999...  HEADACHE (ICD-784.0) - she uses ANTIVERT for dizziness Prn & OTC pain meds Prn.  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  ANEMIA >> on FeSO4 325mg /d + Vit C 500mg /d... ~  11/12: she was mildly anemic w/ Hg=10.6, MCV=88, Fe=54 (14%), SPE= wnl w/o Mspike, & B12/Folate= wnl... DrPatterson did Colonoscopy w/ 2 sessile adenomatous polyps removed... He placed her on FeSO4 325mg  daily & asked her to f/u w/ [place]... ~  2/13: She reports that she donated blood [facility] several  weeks ago & her Hg was reported 13.4, on this basis she declines further blood work here today... ~  Labs 9/13 showed Hg= 13.3  Health Maintenance - GYN= DrDove in [place] on Alendronate, +Calcium, +MVI, +VitD... she is up to date on vaccinations:  gets the yearly seasonal flu vaccine;  given TDAP here 12/11;  due for Pneumovax at age 15 in 2012...   Past Surgical History  Procedure Date  . Orthopedic surgery  related to her polio as a child   . Abdominal hysterectomy   . Cesarean section     X2    Outpatient Encounter Prescriptions as of 06/22/2012  Medication Sig Dispense Refill  . alendronate (FOSAMAX) 70 MG tablet Take 1 tablet (70 mg total) by mouth every 7 (seven) days. Take with a full glass of water on an empty stomach.  12 tablet  5  . ALPRAZolam (XANAX) 0.5 MG tablet Take 1 tablet (0.5 mg total) by mouth 3 (three) times daily as needed. As needed for nerves  270 tablet  3  . amLODipine (NORVASC) 10 MG tablet Take 1 tablet (10 mg total) by mouth daily.  90 tablet  3  . Ascorbic Acid (VITAMIN C) 1000 MG tablet Take 1,000 mg by mouth daily.        Marland Kitchen aspirin 81 MG tablet Take 81 mg by mouth daily.        Marland Kitchen b complex vitamins tablet Take 1 tablet by mouth daily.        . Black Cohosh 540 MG CAPS Take 1 capsule by mouth daily.        . Calcium Carbonate-Vit D-Min (CALCIUM 1200) 1200-1000 MG-UNIT CHEW Chew 1 tablet by mouth at bedtime.      . Cholecalciferol (VITAMIN D) 2000 UNITS CAPS Take 1 capsule by mouth daily.        . clobetasol ointment (TEMOVATE) 0.05 % Use to affected area 3 times weekly  30 g  2  . diphenhydramine-acetaminophen (TYLENOL PM) 25-500 MG TABS Take 2 tablets by mouth at bedtime as needed.        . etodolac (LODINE) 400 MG tablet Take 1 tablet (400 mg total) by mouth 2 (two) times daily. As needed for arthritis pain  180 tablet  3  . OMEGA-3 KRILL OIL 300 MG CAPS Take 1 capsule by mouth daily.        Marland Kitchen omeprazole (PRILOSEC) 20 MG capsule Take 1 capsule (20 mg total)  by mouth daily.  90 capsule  3  . RASPBERRY PO Take 125 mg by mouth daily with breakfast. ketones       . Red Yeast Rice 600 MG CAPS Take 1 capsule by mouth 2 (two) times daily.        Satira Sark Johns Wort 300 MG CAPS Take 1 capsule by mouth 2 (two) times daily.        . traMADol (ULTRAM) 50 MG tablet Take 50 mg by mouth 3 (three) times daily as needed. For pain      . vitamin E 400 UNIT capsule Take 400 Units by mouth daily.        Marland Kitchen DISCONTD: APAP-Isometheptene-Dichloral 325-65-100 MG per capsule Take 1 capsule by mouth every 4 (four) hours as needed for migraine.  25 capsule  0  . DISCONTD: Calcium Carbonate-Vitamin D (CALCIUM + D) 600-200 MG-UNIT TABS Take 1 tablet by mouth daily.          Allergies  Allergen Reactions  . Penicillins Hives and Swelling  . Sulfonamide Derivatives Hives and Swelling    Current Medications, Allergies, Past Medical History, Past Surgical History, Family History, and Social History were reviewed in [employer] record.    Review of Systems        See HPI - all other systems neg except as noted...  The patient denies anorexia, fever, weight loss, weight gain, vision loss, decreased hearing, hoarseness, chest pain, syncope, dyspnea on exertion, peripheral edema,  prolonged cough, headaches, hemoptysis, abdominal pain, melena, hematochezia, severe indigestion/heartburn, hematuria, incontinence, muscle weakness, suspicious skin lesions, transient blindness, difficulty walking, depression, unusual weight change, abnormal bleeding, enlarged lymph nodes, and angioedema.   Objective:   Physical Exam    WD, WN, 66 y/o WF in NAD... GENERAL:  Alert & oriented; pleasant & cooperative... HEENT:  [place]/AT, EOM-wnl, PERRLA, EACs-clear, TMs-wnl, NOSE-clear, THROAT-clear & wnl. NECK:  Supple w/ fairROM; no JVD; normal carotid impulses w/o bruits; no thyromegaly or nodules palpated; no lymphadenopathy. CHEST:  Clear to P & A; without wheezes/ rales/ or  rhonchi heard... HEART:  Regular Rhythm; without murmurs/ rubs/ or gallops detected... ABDOMEN:  Soft & nontender; normal bowel sounds; no organomegaly or masses palpated... EXT:  hx polio right leg w/ musc atrophy, shorter, long leg brace in place,  mild arthritic changes;  no varicose veins/ sl venous insuffic/ tr edema.... NEURO:  CN's intact; focal weakness in right leg from old polio, sensory testing normal; gait abnormal & balance fair. DERM:  No lesions noted; no rash etc...  RADIOLOGY DATA:  Reviewed in the EPIC EMR & discussed w/ the patient...  LABORATORY DATA:  Reviewed in the EPIC EMR & discussed w/ the patient...   Assessment & Plan:    HBP>  Controlled on Amlodipine monotherapy + diet & reminded of no salt etc...  CHOL>  Her TChol & LDL are not at goal but her HDL is very hi & todays labs are improved over prev w/ diet & RYR added; continue same...  GI> GERD, Polyps>  Follow up colon from [facility] 11/12 showed 2 sessile adenomatous polyps, f/u again in 3 yrs planned.  Hx DJD & FM>  Aware, she uses Etodolac, Tramadol, Tylenol, etc...  Osteopenia/ Vit D Defic>  On Alendronate, calcium, MVI, Vit D...  Hx Polio>  Stable, she has leg brace & encouraged to get plenty of exercise...  Anxiety>  On Alprazolam for prn use...  ANEMIA>  This has resolved on her Fe therapy & Hg was 13.4 recently at the [facility]...   Patient's Medications  New Prescriptions   No medications on file  Previous Medications   ALENDRONATE (FOSAMAX) 70 MG TABLET    Take 1 tablet (70 mg total) by mouth every 7 (seven) days. Take with a full glass of water on an empty stomach.   ALPRAZOLAM (XANAX) 0.5 MG TABLET    Take 1 tablet (0.5 mg total) by mouth 3 (three) times daily as needed. As needed for nerves   AMLODIPINE (NORVASC) 10 MG TABLET    Take 1 tablet (10 mg total) by mouth daily.   ASCORBIC ACID (VITAMIN C) 1000 MG TABLET    Take 1,000 mg by mouth daily.     ASPIRIN 81 MG TABLET    Take 81  mg by mouth daily.     B COMPLEX VITAMINS TABLET    Take 1 tablet by mouth daily.     BLACK COHOSH 540 MG CAPS    Take 1 capsule by mouth daily.     CALCIUM CARBONATE-VIT D-MIN (CALCIUM 1200) 1200-1000 MG-UNIT CHEW    Chew 1 tablet by mouth at bedtime.   CHOLECALCIFEROL (VITAMIN D) 2000 UNITS CAPS    Take 1 capsule by mouth daily.     CLOBETASOL OINTMENT (TEMOVATE) 0.05 %    Use to affected area 3 times weekly   DIPHENHYDRAMINE-ACETAMINOPHEN (TYLENOL PM) 25-500 MG TABS    Take 2 tablets by mouth at bedtime as needed.  ETODOLAC (LODINE) 400 MG TABLET    Take 1 tablet (400 mg total) by mouth 2 (two) times daily. As needed for arthritis pain   OMEGA-3 KRILL OIL 300 MG CAPS    Take 1 capsule by mouth daily.     OMEPRAZOLE (PRILOSEC) 20 MG CAPSULE    Take 1 capsule (20 mg total) by mouth daily.   RASPBERRY PO    Take 125 mg by mouth daily with breakfast. ketones    RED YEAST RICE 600 MG CAPS    Take 1 capsule by mouth 2 (two) times daily.     ST JOHNS WORT 300 MG CAPS    Take 1 capsule by mouth 2 (two) times daily.     TRAMADOL (ULTRAM) 50 MG TABLET    Take 50 mg by mouth 3 (three) times daily as needed. For pain   VITAMIN E 400 UNIT CAPSULE    Take 400 Units by mouth daily.    Modified Medications   No medications on file  Discontinued Medications   APAP-ISOMETHEPTENE-DICHLORAL 325-65-100 MG PER CAPSULE    Take 1 capsule by mouth every 4 (four) hours as needed for migraine.   CALCIUM CARBONATE-VITAMIN D (CALCIUM + D) 600-200 MG-UNIT TABS    Take 1 tablet by mouth daily.

## 2012-09-07 ENCOUNTER — Other Ambulatory Visit: Payer: Self-pay | Admitting: *Deleted

## 2012-09-07 MED ORDER — OMEPRAZOLE 20 MG PO CPDR: 20.0000 mg | DELAYED_RELEASE_CAPSULE | Freq: Every day | ORAL | Status: DC

## 2012-09-08 ENCOUNTER — Telehealth: Payer: Self-pay | Admitting: Pulmonary Disease

## 2012-09-08 MED ORDER — ALPRAZOLAM 0.5 MG PO TABS: 0.5000 mg | ORAL_TABLET | Freq: Three times a day (TID) | ORAL | Status: DC | PRN

## 2012-09-08 MED ORDER — OMEPRAZOLE 20 MG PO CPDR: 20.0000 mg | DELAYED_RELEASE_CAPSULE | Freq: Every day | ORAL | Status: DC

## 2012-09-08 NOTE — Telephone Encounter (Signed)
Called and spoke with pt and she is aware of rx that will be faxed to the pharmacy.  Nothing further needed.

## 2012-10-25 ENCOUNTER — Encounter: Payer: Self-pay | Admitting: Obstetrics & Gynecology

## 2012-10-25 ENCOUNTER — Ambulatory Visit (INDEPENDENT_AMBULATORY_CARE_PROVIDER_SITE_OTHER): Payer: Medicare Other | Admitting: Obstetrics & Gynecology

## 2012-10-25 VITALS — BP 151/83 | HR 82 | Wt 146.0 lb

## 2012-10-25 DIAGNOSIS — L900 Lichen sclerosus et atrophicus: Secondary | ICD-10-CM

## 2012-10-25 DIAGNOSIS — L940 Localized scleroderma [morphea]: Secondary | ICD-10-CM

## 2012-10-25 MED ORDER — CLOBETASOL PROPIONATE 0.05 % EX OINT: TOPICAL_OINTMENT | CUTANEOUS | Status: DC

## 2012-10-25 NOTE — Progress Notes (Signed)
  Subjective:    Patient ID: Veronica Strong, female    DOB: 1946-09-18, 67 y.o.   MRN: [id number]  HPI  She is here for a 6 month check of her lichen sclerosis. She denies any problems with it. She is sexually active and denies dyspareunia. She has lost weight by walking a mile every day. She has had her flu vaccine this season.  Review of Systems     Objective:   Physical Exam  Atrophic, healthy appearing vulva. NED      Assessment & Plan:   Lichen sclerosis well controlled with temovate 3 times per week. She declines vaginal estrogen. RTC 6 months

## 2012-11-10 ENCOUNTER — Other Ambulatory Visit: Payer: Self-pay | Admitting: Pulmonary Disease

## 2012-12-19 ENCOUNTER — Ambulatory Visit: Payer: Medicare Other | Admitting: Pulmonary Disease

## 2013-01-10 ENCOUNTER — Other Ambulatory Visit: Payer: Self-pay | Admitting: Pulmonary Disease

## 2013-01-16 ENCOUNTER — Telehealth: Payer: Self-pay | Admitting: Pulmonary Disease

## 2013-01-16 NOTE — Telephone Encounter (Signed)
Fasting labs will not be due again until 06/2013.  thanks

## 2013-01-16 NOTE — Telephone Encounter (Signed)
Will pt need fasting labs for appt on 01-29-13?  Please advise

## 2013-01-16 NOTE — Telephone Encounter (Signed)
Pt aware. Nothing further was needed 

## 2013-01-17 ENCOUNTER — Telehealth: Payer: Self-pay | Admitting: Pulmonary Disease

## 2013-01-17 NOTE — Telephone Encounter (Signed)
Per SN-okay to have patient be seen only once a year.

## 2013-01-17 NOTE — Telephone Encounter (Signed)
I spoke with pt and she is wanting to know why her appts changed to every 6 months from  Once a year. She stated especially since she does not need to have labs done at her next appt. Pt states she has no complaints and feels great.  Please advise SN thanks Last OV 06/22/12 Pending 01/29/13

## 2013-01-17 NOTE — Telephone Encounter (Signed)
Pt is aware. Nothing further needed 

## 2013-01-29 ENCOUNTER — Ambulatory Visit: Payer: Medicare Other | Admitting: Pulmonary Disease

## 2013-03-05 ENCOUNTER — Other Ambulatory Visit: Payer: Self-pay | Admitting: Pulmonary Disease

## 2013-03-05 DIAGNOSIS — Z1231 Encounter for screening mammogram for malignant neoplasm of breast: Secondary | ICD-10-CM

## 2013-04-17 ENCOUNTER — Ambulatory Visit: Payer: Medicare Other

## 2013-04-26 ENCOUNTER — Ambulatory Visit (INDEPENDENT_AMBULATORY_CARE_PROVIDER_SITE_OTHER): Payer: Medicare Other | Admitting: Obstetrics & Gynecology

## 2013-04-26 ENCOUNTER — Ambulatory Visit (INDEPENDENT_AMBULATORY_CARE_PROVIDER_SITE_OTHER): Payer: Medicare Other

## 2013-04-26 ENCOUNTER — Encounter: Payer: Self-pay | Admitting: Obstetrics & Gynecology

## 2013-04-26 VITALS — BP 120/67 | HR 64 | Resp 16 | Ht 63.75 in | Wt 140.0 lb

## 2013-04-26 DIAGNOSIS — Z1231 Encounter for screening mammogram for malignant neoplasm of breast: Secondary | ICD-10-CM

## 2013-04-26 DIAGNOSIS — Z0000 Encounter for general adult medical examination without abnormal findings: Secondary | ICD-10-CM

## 2013-04-26 DIAGNOSIS — L940 Localized scleroderma [morphea]: Secondary | ICD-10-CM

## 2013-04-26 DIAGNOSIS — Z01419 Encounter for gynecological examination (general) (routine) without abnormal findings: Secondary | ICD-10-CM

## 2013-04-26 NOTE — Progress Notes (Signed)
Subjective:    Veronica Strong is a 67 y.o. female who presents for an annual exam. The patient has no complaints today. The patient is sexually active. GYN screening history: last pap: was normal in 2010. The patient wears seatbelts: yes. The patient participates in regular exercise: yes. (walks a mile 6 days per week)  Has the patient ever been transfused or tattooed?: no. The patient reports that there is not domestic violence in her life.   Menstrual History: OB History   Grav Para Term Preterm Abortions TAB SAB Ect Mult Living                  Menarche age: 28 Coitarche: 49 when she got married. .  No LMP recorded. Patient has had a hysterectomy.    The following portions of the patient's history were reviewed and updated as appropriate: allergies, current medications, past family history, past medical history, past social history, past surgical history and problem list.  Review of Systems A comprehensive review of systems was negative.  Mammogram normal this month. She had a bone density in 2013 and a colonoscopy every 3 years. Recently started working 20 hours per week at [employer] and has lost 6 pounds.   Objective:    BP 120/67  Pulse 64  Resp 16  Ht 5' 3.75" (1.619 m)  Wt 140 lb (63.504 kg)  BMI 24.23 kg/m2  General Appearance:    Alert, cooperative, no distress, appears stated age  Head:    Normocephalic, without obvious abnormality, atraumatic  Eyes:    PERRL, conjunctiva/corneas clear, EOM's intact, fundi    benign, both eyes  Ears:    Normal TM's and external ear canals, both ears  Nose:   Nares normal, septum midline, mucosa normal, no drainage    or sinus tenderness  Throat:   Lips, mucosa, and tongue normal; teeth and gums normal  Neck:   Supple, symmetrical, trachea midline, no adenopathy;    thyroid:  no enlargement/tenderness/nodules; no carotid   bruit or JVD  Back:     Symmetric, no curvature, ROM normal, no CVA tenderness  Lungs:     Clear to  auscultation bilaterally, respirations unlabored  Chest Wall:    No tenderness or deformity   Heart:    Regular rate and rhythm, S1 and S2 normal, no murmur, rub   or gallop  Breast Exam:    No tenderness, masses, or nipple abnormality  Abdomen:     Soft, non-tender, bowel sounds active all four quadrants,    no masses, no organomegaly  Genitalia:    Normal female without lesion, discharge or tenderness, v v atrophy, but no evidence of lichen sclerosis, normal vagina and bimanual exam     Extremities:   Extremities normal, atraumatic, no cyanosis or edema  Pulses:   2+ and symmetric all extremities  Skin:   Skin color, texture, turgor normal, no rashes or lesions  Lymph nodes:   Cervical, supraclavicular, and axillary nodes normal  Neurologic:   CNII-XII intact, normal strength, sensation and reflexes    throughout  .    Assessment:    Healthy female exam.  Lichen sclerosis   Plan:     Breast self exam technique reviewed and patient encouraged to perform self-exam monthly.  Rec SVE monthly and every 6 month vulva checks at the office Continue temovate 3 times per week

## 2013-05-07 ENCOUNTER — Telehealth: Payer: Self-pay | Admitting: *Deleted

## 2013-05-07 NOTE — Telephone Encounter (Signed)
Pt called wanting to update her medication list.

## 2013-05-31 ENCOUNTER — Other Ambulatory Visit: Payer: Self-pay | Admitting: *Deleted

## 2013-05-31 DIAGNOSIS — M810 Age-related osteoporosis without current pathological fracture: Secondary | ICD-10-CM

## 2013-05-31 MED ORDER — ALENDRONATE SODIUM 70 MG PO TABS: 70.0000 mg | ORAL_TABLET | ORAL | Status: DC

## 2013-05-31 NOTE — Telephone Encounter (Signed)
RF authorization for Fosamax sent to [facility] per Dr Marice Potter.

## 2013-06-25 ENCOUNTER — Ambulatory Visit: Payer: Medicare Other | Admitting: Pulmonary Disease

## 2013-07-02 ENCOUNTER — Encounter: Payer: Self-pay | Admitting: Pulmonary Disease

## 2013-07-02 ENCOUNTER — Other Ambulatory Visit (INDEPENDENT_AMBULATORY_CARE_PROVIDER_SITE_OTHER): Payer: Medicare Other

## 2013-07-02 ENCOUNTER — Ambulatory Visit (INDEPENDENT_AMBULATORY_CARE_PROVIDER_SITE_OTHER)
Admission: RE | Admit: 2013-07-02 | Discharge: 2013-07-02 | Disposition: A | Discharge status: Home / Self Care | Payer: Medicare Other | Source: Ambulatory Visit | Attending: Pulmonary Disease | Admitting: Pulmonary Disease

## 2013-07-02 ENCOUNTER — Ambulatory Visit (INDEPENDENT_AMBULATORY_CARE_PROVIDER_SITE_OTHER): Payer: Medicare Other | Admitting: Pulmonary Disease

## 2013-07-02 VITALS — BP 114/72 | HR 72 | Temp 98.4°F | Ht 63.0 in | Wt 142.2 lb

## 2013-07-02 DIAGNOSIS — E559 Vitamin D deficiency, unspecified: Secondary | ICD-10-CM

## 2013-07-02 DIAGNOSIS — E7800 Pure hypercholesterolemia, unspecified: Secondary | ICD-10-CM

## 2013-07-02 DIAGNOSIS — D126 Benign neoplasm of colon, unspecified: Secondary | ICD-10-CM

## 2013-07-02 DIAGNOSIS — I10 Essential (primary) hypertension: Secondary | ICD-10-CM

## 2013-07-02 DIAGNOSIS — M899 Disorder of bone, unspecified: Secondary | ICD-10-CM

## 2013-07-02 DIAGNOSIS — F411 Generalized anxiety disorder: Secondary | ICD-10-CM

## 2013-07-02 DIAGNOSIS — M159 Polyosteoarthritis, unspecified: Secondary | ICD-10-CM

## 2013-07-02 DIAGNOSIS — A809 Acute poliomyelitis, unspecified: Secondary | ICD-10-CM

## 2013-07-02 DIAGNOSIS — M8580 Other specified disorders of bone density and structure, unspecified site: Secondary | ICD-10-CM

## 2013-07-02 DIAGNOSIS — IMO0001 Reserved for inherently not codable concepts without codable children: Secondary | ICD-10-CM

## 2013-07-02 DIAGNOSIS — K219 Gastro-esophageal reflux disease without esophagitis: Secondary | ICD-10-CM

## 2013-07-02 DIAGNOSIS — D649 Anemia, unspecified: Secondary | ICD-10-CM

## 2013-07-02 LAB — CBC WITH DIFFERENTIAL/PLATELET
Basophils Absolute: 0 10*3/uL (ref 0.0–0.1)
Basophils Relative: 0.5 % (ref 0.0–3.0)
Eosinophils Absolute: 0.1 10*3/uL (ref 0.0–0.7)
Eosinophils Relative: 2.1 % (ref 0.0–5.0)
HCT: 34.6 % — ABNORMAL LOW (ref 36.0–46.0)
Hemoglobin: 11.7 g/dL — ABNORMAL LOW (ref 12.0–15.0)
Lymphocytes Relative: 22.2 % (ref 12.0–46.0)
Lymphs Abs: 1.4 10*3/uL (ref 0.7–4.0)
MCHC: 33.8 g/dL (ref 30.0–36.0)
MCV: 91.9 fl (ref 78.0–100.0)
Monocytes Absolute: 0.4 10*3/uL (ref 0.1–1.0)
Monocytes Relative: 5.9 % (ref 3.0–12.0)
Neutro Abs: 4.3 10*3/uL (ref 1.4–7.7)
Neutrophils Relative %: 69.3 % (ref 43.0–77.0)
Platelets: 222 10*3/uL (ref 150.0–400.0)
RBC: 3.77 Mil/uL — ABNORMAL LOW (ref 3.87–5.11)
RDW: 14.6 % (ref 11.5–14.6)
WBC: 6.3 10*3/uL (ref 4.5–10.5)

## 2013-07-02 LAB — BASIC METABOLIC PANEL
BUN: 24 mg/dL — ABNORMAL HIGH (ref 6–23)
CO2: 27 mEq/L (ref 19–32)
Calcium: 9.3 mg/dL (ref 8.4–10.5)
Chloride: 106 mEq/L (ref 96–112)
Creatinine, Ser: 0.6 mg/dL (ref 0.4–1.2)
GFR: 112.21 mL/min (ref >60.00)
Glucose, Bld: 89 mg/dL (ref 70–99)
Potassium: 4.1 mEq/L (ref 3.5–5.1)
Sodium: 139 mEq/L (ref 135–145)

## 2013-07-02 LAB — LIPID PANEL
Cholesterol: 258 mg/dL — ABNORMAL HIGH (ref 0–200)
HDL: 105.7 mg/dL (ref >39.00)
Total CHOL/HDL Ratio: 2
Triglycerides: 55 mg/dL (ref 0.0–149.0)
VLDL: 11 mg/dL (ref 0.0–40.0)

## 2013-07-02 LAB — HEPATIC FUNCTION PANEL
ALT: 17 U/L (ref 0–35)
AST: 20 U/L (ref 0–37)
Albumin: 3.9 g/dL (ref 3.5–5.2)
Alkaline Phosphatase: 82 U/L (ref 39–117)
Bilirubin, Direct: 0 mg/dL (ref 0.0–0.3)
Total Bilirubin: 0.7 mg/dL (ref 0.3–1.2)
Total Protein: 7.3 g/dL (ref 6.0–8.3)

## 2013-07-02 NOTE — Patient Instructions (Addendum)
Today we updated your med list in our EPIC system...    Continue your current medications the same...  Today we did your follow up CXR & FASTING blood work...    We will contact you w/ the results when available...   Keep up the good work w/ diet 7 exercise!!!  Call for any questions...  Let's plan a follow up visit in 40yr, sooner if needed for problems.Marland KitchenMarland Kitchen

## 2013-07-02 NOTE — Progress Notes (Signed)
Subjective:    Patient ID: Veronica Strong, female    DOB: Mar 08, 1946, 67 y.o.   MRN: [id number]  HPI 67 y/o WF here for a yearly follow up visit... he has multiple medical problems as noted below...    ~  October 09, 2009:  she is under considerable stress- new husb, Veronica Strong, is dying w/ lung cancer & liver mets, Rx at [facility]- on Hospice now... meds refilled- see problem list below.  ~  October 13, 2010:  Yearly ROV & f/u HBP, Chol, etc... she reports much stress w/ death of her second husb & several family members this yr... medically OK w/ 13# weight reduction on diet, BP stable, trying to control Chol w/ diet alone + KrillOil now (refuses statin rx)... she sees DrDove in [place] GYN w/ BMD 25/11 showing Osteopenia & started on Alendronate...  she had seasonal flu vaccine already, and due to TDAP today (she will turn 54 soon & get Pneumovax in 2012)... wants 90d refill meds today.  ~  June 23, 2011:  76mo ROV & she tells me she's been working on her diet & Chol, wt down 7# to 139# today, & fasting labs show sl improvement w/ this & RYR, she is encouraged & wants to continue diet management even though she is not yet at the goal...    >BP looks good on Amlodipine monotherapy & measures 132/74 today w/o CP, palpit, syncope, SOB, edema; exercise is limited by her hx polio & leg brace...    >GI is stable on Omep20 & she is due for f/u colonoscopy soon per DrPatterson...    >She has DJD & Osteopenia w/ BMD -1.9 last yr & she is on Fosamax, calcium, MVI, Vit D 2000u daily...    >Anxiety managed w/ Alpraz prn...  ~  November 29, 2011:  774mo ROV & follow up per DrPatterson> she had routine colonoscopy 11/12 w/ 2 sessile adenomatous polyps removed- one on cecum, one in rectum; she was mildly anemic w/ Hg=10.6, MCV=88, Fe=54 (14%), SPE= wnl w/o Mspike, & B12/Folate= wnl... He placed her on FeSO4 325mg  daily & asked her to f/u w/ [place];  She reports feeling well, no new complaints or concerns; in fact  she donated blood at the [facility] several weeks ago & her Hg was reported 13.4, on this basis she declines further blood work here today...  ~  June 22, 2012:  74mo ROV & Veronica Strong is doing satis- no new complaints or concerns;  She describes some recent visual problem & after eval they felt it was an ocular migraine;  She is using Tramadol as needed for pain;  BP controlled on Norvasc & she denies CP, palpit, SOB, or edema; she been exercising by walking ~38mi/d; she is taking a number of supplements & feeling well...    We reviewed prob list, meds, xrays and labs> see below>> LABS 9/13:  FLP- not at goals but HDL=95;  Chems- wnl;  CBC- wnl;  TSH=1.40   ~  July 02, 2013:  37yr ROV & Veronica Strong reports a good yr- no new complaints or concerns, she has ret to work part time... We reviewed the following medical problems during today's office visit >>     HBP> on ASA81, Amlod5; BP= 114/72 & she denies CP, palpit, ch in SOB, edema, etc...     Chol> on diet, Krill Oil & RYR; FLP 9/14 showed TChol 258, TG 55, HDL 106, LDL 124; she does not want even low dose  meds, prefers just diet alone...    GI- GERD, polyps> on Omep20; known modHH controlled w/ med; hx adenomatous polyps and some dysplasia- last colon 11/12 by drPatterson & he plans repeat 23yrs...    DJD, ?FM> on Etodolac400 & Tramadol50; hx right tibial fx after fall, wears brace on right due to hx polio; hx fatigue & some trigger pts in past...    Osteopenia> f/u BMD 6/13 showed TScores -1.1 in Left Femur and -2.1 in left forearm; she remains on Fosamax, MVI, VitD, etc...    HxPolio> w/ atrophy of muscles in right leg, wears brace, DrSue wrote her out on disability in 1999...    Migraines> rarely gets HAs- uses Lodine & Tramadol prn...     Anxiety> on Alpraz0,5mg  Tid as needed...    Anemia> supposed to be on Fe w/ VitC, she takes numerous supplements; Hg= 11.7, MCV=92; she is a [employer] blood donor. We reviewed prob list, meds, xrays and labs> see  below for updates >> she has already received the 2014 Flu vaccine...  CXR 9/14 showed norm heart size, clear lungs, degen changes in Tspine, & part compression in upper Lspine w/o change...  LABS 9/15:  FLP- not at goals, but excellent HDL, she prefers diet alone;  Chems- wnl;  CBC- mild anemia w/ Hg=11.7, MCV=92;  TSH=1.50;  VitD=68...            Problem List:   HYPERTENSION (ICD-401.9) - on ASA 81mg /d,  NORVASC 10mg /d... ~  12/11:  great job on diet & wt down 13# to 147# today, BP= 130/72 doing well. ~  CXR 12/11 showed normal cor, clear lungs... ~  9/12:  BP= 132/74 & feeling well, takes med regularly, tolerated well... denies visual changes, CP, palipit, dizziness, syncope, dyspnea, edema, etc... ~  2/13:  BP= 120/72 & she remains asymptomatic... ~  9/13:  BP= 124/60 & she denies CP, palpit, SOB, edema... ~  9/14: on ASA81, Amlod5; BP= 114/72 & she denies CP, palpit, ch in SOB, edema, etc  HYPERCHOLESTEROLEMIA (ICD-272.0) - on Krill Oil + diet & added RYR... she has very hi HDL's... refuses statin rx. ~  despite TChol in the 280-300 range & LDL in the 120-140 range, her HDL is in the 120-140 range, and she has chosen NOT to take statin med to bring the Total and LDL down... ~  FLP 10/08 showed TChol 287, TG 102, HDL 125, LDL 138 ~  FLP 10/09 shows TChol 281, TG 81, HDL 118, LDL 113 ~  FLP 12/10 showed TChol 290, TG 51, HDL 120, LDL 147... offered low dose Statin, she prefers diet. ~  FLP 12/11 showed TChol 278, TG 85, HDL 86, LDL 157... refuses even low dose statin rx. ~  FLP 9/12 showed TChol 254, TG 63, HDL 118, LDL 118... Improved w/ diet & RYR- continue same... ~  FLP 9/13 showed TChol 241, TG 73, HDL 95, LDL 123 ~  FLP 9/14 on diet + supplements showed TChol 258, TG 55, HDL 106, LDL 124  GERD (ICD-530.81) - on OMEPRAZOLE 20mg /d... she had an EGD in 1999 w/ 4-5cmHH and mod reflux...  COLONIC POLYPS (ICD-211.3) - had f/u colonoscopy by DrPatterson on 07/10/08- sigmiod polyp  removed= tubular adenoma;  also had abn in cecum (granularity/ erosions) w/ bx showing frag of tubular adenoma w/ high grade dysplasia... repeat colonoscopy 07/31/08 showed nodular folds in the cecum but bx was neg= colonic mucosa w/ adenomatous change... he plans f/u in 40yrs. ~  11/12:  She had f/u colonoscopy by [facility] w/ 2 sessile polyps removed- one in cecum & one in rectum; repeat planned in another 69yrs...  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 she fell at home in 1993 and fractured her right prox tibia and metatarsals in her foot... ~  she has a brace on her right leg due to hx polio...  ? of FIBROMYALGIA (ICD-729.1) - on TRAMADOL 50mg  as needed; she has some fatigue & trigger points in the past...  OSTEOPENIA >> TScore 5/11 measured in forearm was -1.9 & she restarted FOSAMAX 70mg /wk... VITAMIN D DEFICIENCY (ICD-268.9) - on Vit D 50K for 102yr then switched to 1000 u OTC daily... ~  labs 10/09 showed Vit D level = 21... rec> Vit D 50K weekly... ~  Labs 12/10 showed Vit D level = 43... rec> switch to 2000 u daily. ~  BMD 6/13 showed Tscores -1.1 in Left Femur and -2.1 in left forearm ([facility] [place])... She remains on Fosamax weekly...   Hx of POLIOMYELITIS (ICD-045.90) - she has atrophy of muscles in right leg and uses a long leg brace per [facility]... DrSue wrote her out of work on disability in 1999...  HEADACHE (ICD-784.0) - she uses ANTIVERT for dizziness Prn & OTC pain meds Prn.  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  ANEMIA >> on FeSO4 325mg /d + Vit C 500mg /d... ~  11/12: she was mildly anemic w/ Hg=10.6, MCV=88, Fe=54 (14%), SPE= wnl w/o Mspike, & B12/Folate= wnl... DrPatterson did Colonoscopy w/ 2 sessile adenomatous polyps removed... He placed her on FeSO4 325mg  daily & asked her to f/u w/ [place]... ~  2/13: She reports that she [facility] weeks ago & her Hg was  reported 13.4, on this basis she declines further blood work here today... ~  Labs 9/13 showed Hg= 13.3 ~  Labs 9/14 showed Hg= 11.7, MCV=92  Health Maintenance - GYN= DrDove in [place] (seen 7/14- note reviewed) on Alendronate, +Calcium, +MVI, +VitD (Mammogram 7/14 was neg)... she is up to date on vaccinations:  gets the yearly seasonal flu vaccine;  given TDAP here 12/11;  due for Pneumovax at age 60 in 2012...   Past Surgical History  Procedure Laterality Date  . Orthopedic surgery related to her polio as a child    . Abdominal hysterectomy    . Cesarean section      X2    Outpatient Encounter Prescriptions as of 07/02/2013  Medication Sig Dispense Refill  . alendronate (FOSAMAX) 70 MG tablet Take 1 tablet (70 mg total) by mouth every 7 (seven) days. Take with a full glass of water on an empty stomach.  12 tablet  5  . ALPRAZolam (XANAX) 0.5 MG tablet Take 1 tablet (0.5 mg total) by mouth 3 (three) times daily as needed for anxiety. As needed for nerves  270 tablet  3  . amLODipine (NORVASC) 10 MG tablet TAKE 1 TABLET EVERY DAY  90 tablet  3  . Ascorbic Acid (VITAMIN C) 1000 MG tablet Take 1,000 mg by mouth daily.        Marland Kitchen aspirin 81 MG tablet Take 81 mg by mouth daily.        . Black Cohosh 40 MG CAPS Take 1 capsule by mouth daily.      Marland Kitchen CALCIUM-MAGNESUIUM-ZINC 333-133-8.3 MG TABS Take 1 tablet by mouth daily.      . Cholecalciferol (VITAMIN D) 2000 UNITS CAPS Take 1 capsule  by mouth daily.        . clobetasol ointment (TEMOVATE) 0.05 % Use to affected area 3 times weekly  60 g  4  . Cyanocobalamin (VITAMIN B 12 PO) Take by mouth daily.      Marland Kitchen etodolac (LODINE) 400 MG tablet TAKE 1 TABLET TWICE DAILY AS NEEDED FOR ARTHRITIS  PAIN  180 tablet  PRN  . Melatonin 3 MG CAPS Take by mouth at bedtime.      . Multiple Vitamin (MULTIVITAMIN) tablet Take 1 tablet by mouth daily.      . OMEGA-3 KRILL OIL 300 MG CAPS Take 1 capsule by mouth daily.        Marland Kitchen omeprazole (PRILOSEC) 20 MG capsule  Take 1 capsule (20 mg total) by mouth daily.  90 capsule  3  . RASPBERRY PO Take 125 mg by mouth daily with breakfast. ketones       . Red Yeast Rice 600 MG CAPS Take 1 capsule by mouth 2 (two) times daily.        Satira Sark Johns Wort 300 MG CAPS Take 1 capsule by mouth 2 (two) times daily.        . traMADol (ULTRAM) 50 MG tablet Take 50 mg by mouth 3 (three) times daily as needed. For pain      . vitamin E 400 UNIT capsule Take 400 Units by mouth daily.        . [DISCONTINUED] b complex vitamins tablet Take 1 tablet by mouth daily.        . [DISCONTINUED] Black Cohosh 540 MG CAPS Take 1 capsule by mouth daily.        . [DISCONTINUED] Calcium Carbonate-Vit D-Min (CALCIUM 1200) 1200-1000 MG-UNIT CHEW Chew 1 tablet by mouth at bedtime.       No facility-administered encounter medications on file as of 07/02/2013.    Allergies  Allergen Reactions  . Penicillins Hives and Swelling  . Sulfonamide Derivatives Hives and Swelling    Current Medications, Allergies, Past Medical History, Past Surgical History, Family History, and Social History were reviewed in [employer] record.    Review of Systems        See HPI - all other systems neg except as noted...  The patient denies anorexia, fever, weight loss, weight gain, vision loss, decreased hearing, hoarseness, chest pain, syncope, dyspnea on exertion, peripheral edema, prolonged cough, headaches, hemoptysis, abdominal pain, melena, hematochezia, severe indigestion/heartburn, hematuria, incontinence, muscle weakness, suspicious skin lesions, transient blindness, difficulty walking, depression, unusual weight change, abnormal bleeding, enlarged lymph nodes, and angioedema.   Objective:   Physical Exam    WD, WN, 67 y/o WF in NAD... GENERAL:  Alert & oriented; pleasant & cooperative... HEENT:  [place]/AT, EOM-wnl, PERRLA, EACs-clear, TMs-wnl, NOSE-clear, THROAT-clear & wnl. NECK:  Supple w/ fairROM; no JVD; normal carotid impulses  w/o bruits; no thyromegaly or nodules palpated; no lymphadenopathy. CHEST:  Clear to P & A; without wheezes/ rales/ or rhonchi heard... HEART:  Regular Rhythm; without murmurs/ rubs/ or gallops detected... ABDOMEN:  Soft & nontender; normal bowel sounds; no organomegaly or masses palpated... EXT:  hx polio right leg w/ musc atrophy, shorter, long leg brace in place,  mild arthritic changes;  no varicose veins/ sl venous insuffic/ tr edema.... NEURO:  CN's intact; focal weakness in right leg from old polio, sensory testing normal; gait abnormal & balance fair. DERM:  No lesions noted; no rash etc...  RADIOLOGY DATA:  Reviewed in the EPIC EMR & discussed  w/ the patient...  LABORATORY DATA:  Reviewed in the EPIC EMR & discussed w/ the patient...   Assessment & Plan:    HBP>  Controlled on Amlodipine monotherapy + diet & reminded of no salt etc...  CHOL>  Her TChol & LDL are not at goal but her HDL is very hi & todays labs are improved over prev w/ diet & RYR added; continue same...  GI> GERD, Polyps>  Follow up colon from [facility] 11/12 showed 2 sessile adenomatous polyps, f/u again in 3 yrs planned.  Hx DJD & FM>  Aware, she uses Etodolac, Tramadol, Tylenol, etc...  Osteopenia/ Vit D Defic>  On Alendronate, calcium, MVI, Vit D...  Hx Polio>  Stable, she has leg brace & encouraged to get plenty of exercise...  Anxiety>  On Alprazolam for prn use...  ANEMIA>  This has resolved on her Fe therapy & Hg was 11.7 - asked to stay on Fe or stop blood donation...   Patient's Medications  New Prescriptions   IBUPROFEN (ADVIL,MOTRIN) 400 MG TABLET    Take 1 tablet (400 mg total) by mouth 2 (two) times daily as needed for pain (arthritis pain).  Previous Medications   ALENDRONATE (FOSAMAX) 70 MG TABLET    Take 1 tablet (70 mg total) by mouth every 7 (seven) days. Take with a full glass of water on an empty stomach.   AMLODIPINE (NORVASC) 10 MG TABLET    TAKE 1 TABLET EVERY DAY   ASCORBIC  ACID (VITAMIN C) 1000 MG TABLET    Take 1,000 mg by mouth daily.     ASPIRIN 81 MG TABLET    Take 81 mg by mouth daily.     BLACK COHOSH 40 MG CAPS    Take 1 capsule by mouth daily.   CALCIUM-MAGNESUIUM-ZINC 333-133-8.3 MG TABS    Take 1 tablet by mouth daily.   CHOLECALCIFEROL (VITAMIN D) 2000 UNITS CAPS    Take 1 capsule by mouth daily.     CLOBETASOL OINTMENT (TEMOVATE) 0.05 %    Use to affected area 3 times weekly   CYANOCOBALAMIN (VITAMIN B 12 PO)    Take by mouth daily.   ETODOLAC (LODINE) 400 MG TABLET    TAKE 1 TABLET TWICE DAILY AS NEEDED FOR ARTHRITIS  PAIN   MELATONIN 3 MG CAPS    Take by mouth at bedtime.   MULTIPLE VITAMIN (MULTIVITAMIN) TABLET    Take 1 tablet by mouth daily.   OMEGA-3 KRILL OIL 300 MG CAPS    Take 1 capsule by mouth daily.     RASPBERRY PO    Take 125 mg by mouth daily with breakfast. ketones    RED YEAST RICE 600 MG CAPS    Take 1 capsule by mouth 2 (two) times daily.     ST JOHNS WORT 300 MG CAPS    Take 1 capsule by mouth 2 (two) times daily.     TRAMADOL (ULTRAM) 50 MG TABLET    Take 50 mg by mouth 3 (three) times daily as needed. For pain   VITAMIN E 400 UNIT CAPSULE    Take 400 Units by mouth daily.    Modified Medications   Modified Medication Previous Medication   ALPRAZOLAM (XANAX) 0.5 MG TABLET ALPRAZolam (XANAX) 0.5 MG tablet      Take 1 tablet (0.5 mg total) by mouth 3 (three) times daily as needed for anxiety. As needed for nerves    Take 1 tablet (0.5 mg total) by mouth 3 (three)  times daily as needed for anxiety. As needed for nerves   OMEPRAZOLE (PRILOSEC) 20 MG CAPSULE omeprazole (PRILOSEC) 20 MG capsule      TAKE 1 CAPSULE EVERY DAY    Take 1 capsule (20 mg total) by mouth daily.  Discontinued Medications   B COMPLEX VITAMINS TABLET    Take 1 tablet by mouth daily.     BLACK COHOSH 540 MG CAPS    Take 1 capsule by mouth daily.     CALCIUM CARBONATE-VIT D-MIN (CALCIUM 1200) 1200-1000 MG-UNIT CHEW    Chew 1 tablet by mouth at bedtime.

## 2013-07-03 LAB — VITAMIN D 25 HYDROXY (VIT D DEFICIENCY, FRACTURES): Vit D, 25-Hydroxy: 68 ng/mL (ref 30–89)

## 2013-07-12 ENCOUNTER — Telehealth: Payer: Self-pay | Admitting: Pulmonary Disease

## 2013-07-12 MED ORDER — IBUPROFEN 400 MG PO TABS: 400.0000 mg | ORAL_TABLET | Freq: Two times a day (BID) | ORAL | Status: DC | PRN

## 2013-07-12 NOTE — Telephone Encounter (Signed)
I spoke with the pt and she states that at last OV she discussed with Dr. Kriste Basque about him sending in an rx for ibuprofen 400mg  for her DJD and FM. I do not see where this has been done. Also she is requesting an rx for tramadol, she states this has been prescribed in the past and helps with her headaches and pain. Pt is requesting a 90 day supply for both med be sent to [facility]. Please advise. Carron Curie, CMA

## 2013-07-12 NOTE — Telephone Encounter (Signed)
Per SN---   Ok to send in the ibuprofen 400 mg   #180   1 po bid prn arthritis pain.  thanks

## 2013-07-12 NOTE — Telephone Encounter (Signed)
LMTCB

## 2013-07-12 NOTE — Telephone Encounter (Signed)
Pt returned triage's call.  Holly D Pryor ° °

## 2013-07-12 NOTE — Telephone Encounter (Signed)
Pt requests that this be sent to [facility]  Ibu 400mg  #180  1 bid prn arthritis pain.

## 2013-07-12 NOTE — Telephone Encounter (Signed)
[email or web address].[email or web address] Pankowski, CMA  

## 2013-07-13 ENCOUNTER — Telehealth: Payer: Self-pay | Admitting: Pulmonary Disease

## 2013-07-13 NOTE — Telephone Encounter (Signed)
I called and spoke with pt. I advised her glucose #. She needed nothing further needed

## 2013-07-23 ENCOUNTER — Telehealth: Payer: Self-pay | Admitting: Pulmonary Disease

## 2013-07-23 MED ORDER — ALPRAZOLAM 0.5 MG PO TABS: 0.5000 mg | ORAL_TABLET | Freq: Three times a day (TID) | ORAL | Status: DC | PRN

## 2013-07-23 NOTE — Telephone Encounter (Signed)
RX last sent to [facility] 09/08/12 #270 x 3 refills.  I spoke with pt and she stated she spoke with [facility] and was advised it was time to refill this medication. She will need this faxed to them. Please advise SN if okay to do so for 90 day supply with refills? Thanks Last OV 07/02/13

## 2013-07-23 NOTE — Telephone Encounter (Signed)
Per SN---  Ok to refill. thanks 

## 2013-07-23 NOTE — Telephone Encounter (Signed)
RX printed and placed on SN cart for signature. This needs to be faxed to [phone]. Please advise leigh thanks

## 2013-07-23 NOTE — Telephone Encounter (Signed)
rx has been faxed to the pharmacy.  

## 2013-09-11 ENCOUNTER — Other Ambulatory Visit: Payer: Self-pay | Admitting: Pulmonary Disease

## 2013-10-23 ENCOUNTER — Telehealth: Payer: Self-pay | Admitting: Pulmonary Disease

## 2013-10-23 NOTE — Telephone Encounter (Signed)
Called and spoke with pt and she stated that she would like to speak with Maryann Conners.  She stated that its not urgent at all she just had a question that she would like to ask TD.  Will forward message to TD and sign off.

## 2013-10-24 ENCOUNTER — Ambulatory Visit: Payer: Medicare Other | Admitting: Obstetrics & Gynecology

## 2013-10-27 ENCOUNTER — Other Ambulatory Visit: Payer: Self-pay | Admitting: Pulmonary Disease

## 2013-10-30 ENCOUNTER — Ambulatory Visit (INDEPENDENT_AMBULATORY_CARE_PROVIDER_SITE_OTHER): Payer: Medicare Other | Admitting: Obstetrics & Gynecology

## 2013-10-30 ENCOUNTER — Encounter: Payer: Self-pay | Admitting: Obstetrics & Gynecology

## 2013-10-30 VITALS — BP 133/76 | HR 76 | Resp 16 | Ht 63.75 in | Wt 150.0 lb

## 2013-10-30 DIAGNOSIS — L900 Lichen sclerosus et atrophicus: Secondary | ICD-10-CM

## 2013-10-30 DIAGNOSIS — L940 Localized scleroderma [morphea]: Secondary | ICD-10-CM

## 2013-10-30 NOTE — Progress Notes (Signed)
   Subjective:    Patient ID: Veronica Strong, female    DOB: [birth year], 68 y.o.   MRN: [id number]  HPI  68 yo lady with lichen sclerosus who is here for her q 6 month vulva check. She is using her temovate three times per week and has no complaints.  Review of Systems     Objective:   Physical Exam   Mild atrophy and no evidence of malignancy     Assessment & Plan:  Lichen- reassurance given. RTC 1 year for annual

## 2013-11-14 ENCOUNTER — Ambulatory Visit: Payer: Medicare Other | Admitting: Obstetrics & Gynecology

## 2014-03-01 ENCOUNTER — Telehealth: Payer: Self-pay | Admitting: Pulmonary Disease

## 2014-03-01 DIAGNOSIS — R51 Headache: Secondary | ICD-10-CM

## 2014-03-01 DIAGNOSIS — D649 Anemia, unspecified: Secondary | ICD-10-CM

## 2014-03-01 NOTE — Telephone Encounter (Signed)
Called and spoke to pt. Pt inquiring if she would still be able to see SN in sept for scheduled apt. Pt was informed that it would not be covered by insurance and that the bill would increase because SN is no longer in primary care. Pt stated she would like a referral to another PCP. Pt specified to be close to the city of [place] or [place] and if those two locations are unavailable she prefers [facility] at [facility]. Order has been placed. Nothing further needed at this time.

## 2014-03-21 ENCOUNTER — Other Ambulatory Visit: Payer: Self-pay | Admitting: Obstetrics & Gynecology

## 2014-03-21 DIAGNOSIS — Z1231 Encounter for screening mammogram for malignant neoplasm of breast: Secondary | ICD-10-CM

## 2014-04-30 ENCOUNTER — Ambulatory Visit (INDEPENDENT_AMBULATORY_CARE_PROVIDER_SITE_OTHER): Payer: Medicare Other

## 2014-04-30 DIAGNOSIS — Z1231 Encounter for screening mammogram for malignant neoplasm of breast: Secondary | ICD-10-CM

## 2014-05-08 ENCOUNTER — Other Ambulatory Visit: Payer: Self-pay | Admitting: *Deleted

## 2014-05-08 ENCOUNTER — Other Ambulatory Visit: Payer: Self-pay | Admitting: Pulmonary Disease

## 2014-05-08 DIAGNOSIS — M810 Age-related osteoporosis without current pathological fracture: Secondary | ICD-10-CM

## 2014-05-08 MED ORDER — ALENDRONATE SODIUM 70 MG PO TABS: 70.0000 mg | ORAL_TABLET | ORAL | Status: DC

## 2014-05-08 NOTE — Telephone Encounter (Signed)
RF authorization sent to [facility] for Fosamax.

## 2014-05-13 ENCOUNTER — Telehealth: Payer: Self-pay | Admitting: Pulmonary Disease

## 2014-05-13 DIAGNOSIS — M810 Age-related osteoporosis without current pathological fracture: Secondary | ICD-10-CM

## 2014-05-13 MED ORDER — TRAMADOL HCL 50 MG PO TABS: 50.0000 mg | ORAL_TABLET | Freq: Three times a day (TID) | ORAL | Status: AC | PRN

## 2014-05-13 MED ORDER — IBUPROFEN 400 MG PO TABS: ORAL_TABLET | ORAL | Status: DC

## 2014-05-13 MED ORDER — ALPRAZOLAM 0.5 MG PO TABS: 0.5000 mg | ORAL_TABLET | Freq: Three times a day (TID) | ORAL | Status: AC | PRN

## 2014-05-13 MED ORDER — OMEPRAZOLE 20 MG PO CPDR: DELAYED_RELEASE_CAPSULE | ORAL | Status: AC

## 2014-05-13 MED ORDER — ETODOLAC 400 MG PO TABS: ORAL_TABLET | ORAL | Status: AC

## 2014-05-13 MED ORDER — AMLODIPINE BESYLATE 10 MG PO TABS: ORAL_TABLET | ORAL | Status: AC

## 2014-05-13 MED ORDER — ALENDRONATE SODIUM 70 MG PO TABS: 70.0000 mg | ORAL_TABLET | ORAL | Status: AC

## 2014-05-13 NOTE — Telephone Encounter (Signed)
Called and spoke with pt and she is needing refills of her meds.  Pt stated that her appt with SN has been cancelled for September and she will try and set up with new PCP before this time.  If not she is aware to call us back and we can schedule her an appt with SN.  Waiting on 2 rx to be signed by SN and i will fax those back.

## 2014-05-14 NOTE — Telephone Encounter (Signed)
rx have been signed by SN and faxed to the mail order pharmacy

## 2014-05-23 ENCOUNTER — Other Ambulatory Visit: Payer: Self-pay | Admitting: *Deleted

## 2014-07-02 ENCOUNTER — Ambulatory Visit: Payer: Medicare Other | Admitting: Pulmonary Disease

## 2014-07-23 ENCOUNTER — Encounter: Payer: Self-pay | Admitting: Internal Medicine

## 2014-09-23 ENCOUNTER — Other Ambulatory Visit: Payer: Self-pay | Admitting: *Deleted

## 2014-09-23 MED ORDER — CLOBETASOL PROPIONATE 0.05 % EX CREA: 1.0000 "application " | TOPICAL_CREAM | Freq: Two times a day (BID) | CUTANEOUS | Status: AC

## 2014-09-23 NOTE — Telephone Encounter (Signed)
Patients refills expired for her clobetasol cream and she would like this called in for her so she doesn't run out prior to her appointment.

## 2014-09-25 ENCOUNTER — Other Ambulatory Visit: Payer: Self-pay | Admitting: Obstetrics & Gynecology

## 2014-09-25 DIAGNOSIS — M810 Age-related osteoporosis without current pathological fracture: Secondary | ICD-10-CM

## 2014-10-31 ENCOUNTER — Ambulatory Visit: Payer: Medicare Other | Admitting: Obstetrics & Gynecology

## 2014-11-13 ENCOUNTER — Ambulatory Visit: Payer: Medicare Other | Admitting: Obstetrics & Gynecology

## 2014-11-13 ENCOUNTER — Other Ambulatory Visit: Payer: Medicare Other

## 2014-11-20 ENCOUNTER — Ambulatory Visit: Payer: Medicare Other

## 2014-11-20 ENCOUNTER — Encounter: Payer: Self-pay | Admitting: Obstetrics & Gynecology

## 2014-11-20 ENCOUNTER — Ambulatory Visit (INDEPENDENT_AMBULATORY_CARE_PROVIDER_SITE_OTHER): Payer: Medicare HMO | Admitting: Obstetrics & Gynecology

## 2014-11-20 VITALS — BP 123/64 | HR 73 | Resp 16 | Ht 63.75 in | Wt 157.0 lb

## 2014-11-20 DIAGNOSIS — L989 Disorder of the skin and subcutaneous tissue, unspecified: Secondary | ICD-10-CM | POA: Diagnosis not present

## 2014-11-20 DIAGNOSIS — A630 Anogenital (venereal) warts: Secondary | ICD-10-CM

## 2014-11-20 DIAGNOSIS — Z01411 Encounter for gynecological examination (general) (routine) with abnormal findings: Secondary | ICD-10-CM

## 2014-11-20 NOTE — Progress Notes (Signed)
Subjective:    Veronica Strong is a 69 y.o. SW lady female who presents for an annual exam. The patient has no complaints today. The patient is sexually active. GYN screening history: last pap: was normal. The patient wears seatbelts: yes. The patient participates in regular exercise: yes. Has the patient ever been transfused or tattooed?: no. The patient reports that there is not domestic violence in her life.   Menstrual History: OB History    No data available      Menarche age: 23  No LMP recorded. Patient has had a hysterectomy.    The following portions of the patient's history were reviewed and updated as appropriate: allergies, current medications, past family history, past medical history, past social history, past surgical history and problem list.  Review of Systems A comprehensive review of systems was negative. Her mammogram is UTD. She has had a flu vaccine this year. Her DEXA is today.   Objective:    BP 123/64 mmHg  Pulse 73  Resp 16  Ht 5' 3.75" (1.619 m)  Wt 157 lb (71.215 kg)  BMI 27.17 kg/m2  General Appearance:    Alert, cooperative, no distress, appears stated age  Head:    Normocephalic, without obvious abnormality, atraumatic  Eyes:    PERRL, conjunctiva/corneas clear, EOM's intact, fundi    benign, both eyes  Ears:    Normal TM's and external ear canals, both ears  Nose:   Nares normal, septum midline, mucosa normal, no drainage    or sinus tenderness  Throat:   Lips, mucosa, and tongue normal; teeth and gums normal  Neck:   Supple, symmetrical, trachea midline, no adenopathy;    thyroid:  no enlargement/tenderness/nodules; no carotid   bruit or JVD  Back:     Symmetric, no curvature, ROM normal, no CVA tenderness  Lungs:     Clear to auscultation bilaterally, respirations unlabored  Chest Wall:    No tenderness or deformity   Heart:    Regular rate and rhythm, S1 and S2 normal, no murmur, rub   or gallop  Breast Exam:    No tenderness, masses, or nipple  abnormality  Abdomen:     Soft, non-tender, bowel sounds active all four quadrants,    no masses, no organomegaly  Genitalia:    Normal female without lesion, discharge or tenderness, normal atrophic vulva, no evidence of cancer. 3x1 cm area of what appears to be condylalomatous changes along the left side of the upper vaginal wall. No masses with bimanual exam. The skin changes were biopsied in several places. She tolerated this well.      Extremities:   Extremities normal, atraumatic, no cyanosis or edema  Pulses:   2+ and symmetric all extremities  Skin:   Skin color, texture, turgor normal, no rashes or lesions  Lymph nodes:   Cervical, supraclavicular, and axillary nodes normal  Neurologic:   CNII-XII intact, normal strength, sensation and reflexes    throughout  .    Assessment:    Healthy female exam.   Skin changes in the vagina   Plan:     Breast self exam technique reviewed and patient encouraged to perform self-exam monthly. await pathology

## 2014-11-22 ENCOUNTER — Encounter: Payer: Self-pay | Admitting: Obstetrics & Gynecology

## 2014-11-27 ENCOUNTER — Ambulatory Visit (INDEPENDENT_AMBULATORY_CARE_PROVIDER_SITE_OTHER): Payer: Medicare HMO | Admitting: Obstetrics & Gynecology

## 2014-11-27 ENCOUNTER — Encounter: Payer: Self-pay | Admitting: Obstetrics & Gynecology

## 2014-11-27 ENCOUNTER — Ambulatory Visit (INDEPENDENT_AMBULATORY_CARE_PROVIDER_SITE_OTHER): Payer: Medicare HMO

## 2014-11-27 VITALS — BP 123/74 | HR 84 | Resp 16 | Ht 63.25 in | Wt 150.0 lb

## 2014-11-27 DIAGNOSIS — M859 Disorder of bone density and structure, unspecified: Secondary | ICD-10-CM

## 2014-11-27 DIAGNOSIS — A630 Anogenital (venereal) warts: Secondary | ICD-10-CM | POA: Diagnosis not present

## 2014-12-03 NOTE — Progress Notes (Signed)
   Subjective:    Patient ID: Veronica Strong, female    DOB: [birth year], 69 y.o.   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  Review of Systems     Objective:   Physical Exam  TCA applied to the vaginal warts. She tolerated this well.      Assessment & Plan:  Vaginal warts- RTC weekly for TCA treatment

## 2014-12-04 ENCOUNTER — Ambulatory Visit (INDEPENDENT_AMBULATORY_CARE_PROVIDER_SITE_OTHER): Payer: Medicare HMO | Admitting: Obstetrics & Gynecology

## 2014-12-04 DIAGNOSIS — Z224 Carrier of infections with a predominantly sexual mode of transmission: Secondary | ICD-10-CM

## 2014-12-04 DIAGNOSIS — A630 Anogenital (venereal) warts: Secondary | ICD-10-CM

## 2014-12-04 NOTE — Progress Notes (Signed)
   Subjective:    Patient ID: Veronica Strong, female    DOB: August 27, 1946, 69 y.o.   MRN: [id number]  HPI  Ellajane is here for her second application of TCA. She had only some mild stinging after her first treatment.   Review of Systems     Objective:   Physical Exam  Remarkable decrease of the vaginal warts, especially the distal end. There were still some near the cervix. I treated these with TCA and applied lubricant to the other side to help prevent spread of the acid. She tolerated the procedure well.       Assessment & Plan:  Vaginal condyloma RTC 2 weeks/prn sooner

## 2014-12-18 ENCOUNTER — Ambulatory Visit (INDEPENDENT_AMBULATORY_CARE_PROVIDER_SITE_OTHER): Payer: Medicare HMO | Admitting: Obstetrics & Gynecology

## 2014-12-18 DIAGNOSIS — A630 Anogenital (venereal) warts: Secondary | ICD-10-CM

## 2014-12-18 NOTE — Progress Notes (Signed)
   Subjective:    Patient ID: Veronica Strong, female    DOB: 1946-09-30, 69 y.o.   MRN: [id number]  HPI Veronica Strong is her for another TCA treatment for her vaginal warts. She is also here to discuss her DEXA results. She hasn't changed her lifestyle or meds or diet   Review of Systems     Objective:   Physical Exam  Her right vaginal wall warts are almost completely gone. I retreated the wart still present.      Assessment & Plan:  Vaginal warts- RTC 1 month Low bone mass- some improvement. Continue fosamax

## 2015-01-15 ENCOUNTER — Encounter: Payer: Self-pay | Admitting: Obstetrics & Gynecology

## 2015-01-15 ENCOUNTER — Ambulatory Visit (INDEPENDENT_AMBULATORY_CARE_PROVIDER_SITE_OTHER): Payer: Medicare HMO | Admitting: Obstetrics & Gynecology

## 2015-01-15 VITALS — BP 135/76 | HR 77 | Resp 16 | Ht 63.75 in | Wt 158.0 lb

## 2015-01-15 DIAGNOSIS — A630 Anogenital (venereal) warts: Secondary | ICD-10-CM

## 2015-01-15 NOTE — Progress Notes (Signed)
   Subjective:    Patient ID: Veronica Strong, female    DOB: April 10, 1946, 69 y.o.   MRN: [id number]  HPI  She is here for repeat application of TCA.  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 I treated it with TCA.       Assessment & Plan:   Condyloma- RTC 1 week

## 2015-01-20 ENCOUNTER — Encounter: Payer: Self-pay | Admitting: Internal Medicine

## 2015-01-23 ENCOUNTER — Ambulatory Visit (INDEPENDENT_AMBULATORY_CARE_PROVIDER_SITE_OTHER): Payer: Medicare HMO | Admitting: Obstetrics & Gynecology

## 2015-01-23 ENCOUNTER — Encounter: Payer: Self-pay | Admitting: Obstetrics & Gynecology

## 2015-01-23 VITALS — BP 139/78 | HR 77 | Resp 16 | Ht 63.5 in | Wt 151.0 lb

## 2015-01-23 DIAGNOSIS — A630 Anogenital (venereal) warts: Secondary | ICD-10-CM

## 2015-01-23 NOTE — Progress Notes (Signed)
   Subjective:    Patient ID: Veronica Strong, female    DOB: [birth year], 69 y.o.   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 She tolerated this well.      Assessment & Plan:  Condyloma- continue weekly TCA

## 2015-01-29 ENCOUNTER — Ambulatory Visit (INDEPENDENT_AMBULATORY_CARE_PROVIDER_SITE_OTHER): Payer: Medicare HMO | Admitting: Obstetrics & Gynecology

## 2015-01-29 DIAGNOSIS — A630 Anogenital (venereal) warts: Secondary | ICD-10-CM | POA: Diagnosis not present

## 2015-01-29 NOTE — Progress Notes (Signed)
   Subjective:    Patient ID: Veronica Strong, female    DOB: 1946-09-04, 69 y.o.   MRN: [id number]  HPI She is here for another application of TCA. No complaints.   Review of Systems     Objective:   Physical Exam   Decreasing size of right vaginal wall condyloma (2 left, each about 5 mm) I applied TCA to warts and vaseline to the other side of her vagina (same as every visit). She tolerated this well.     Assessment & Plan:  Vaginal wall condyloma RTC 1 week

## 2015-02-05 ENCOUNTER — Ambulatory Visit: Payer: Medicare HMO | Admitting: Obstetrics & Gynecology

## 2015-02-12 ENCOUNTER — Ambulatory Visit (INDEPENDENT_AMBULATORY_CARE_PROVIDER_SITE_OTHER): Payer: Medicare HMO | Admitting: Obstetrics & Gynecology

## 2015-02-12 DIAGNOSIS — A630 Anogenital (venereal) warts: Secondary | ICD-10-CM | POA: Diagnosis not present

## 2015-02-12 NOTE — Progress Notes (Signed)
   Subjective:    Patient ID: Veronica Strong, female    DOB: Mar 19, 1946, 69 y.o.   MRN: [id number]  HPI    Review of Systems     Objective:   Physical Exam   Decreasing size of right vaginal wall condyloma (2 left, each about 5 mm) I applied TCA to warts and vaseline to the other side of her vagina (same as every visit).  I used the long straight probe on the cryo machine to freeze both warts.  She tolerated this well.     Assessment & Plan:  Vaginal warts- RTC 1 week

## 2015-02-26 ENCOUNTER — Ambulatory Visit (INDEPENDENT_AMBULATORY_CARE_PROVIDER_SITE_OTHER): Payer: Medicare HMO | Admitting: Obstetrics & Gynecology

## 2015-02-26 ENCOUNTER — Encounter: Payer: Self-pay | Admitting: Obstetrics & Gynecology

## 2015-02-26 VITALS — BP 163/81 | HR 73 | Ht 64.0 in | Wt 153.0 lb

## 2015-02-26 DIAGNOSIS — A630 Anogenital (venereal) warts: Secondary | ICD-10-CM

## 2015-02-26 NOTE — Progress Notes (Signed)
   Subjective:    Patient ID: Veronica Strong, female    DOB: Mar 13, 1946, 69 y.o.   MRN: [id number]  HPI  Veronica Strong is here for repeat TCA. Denies problems.   Review of Systems     Objective:   Physical Exam  No change in size of right vaginal wall condyloma (2 left, each about 5 mm) I applied TCA to warts and vaseline to the other side of her vagina (same as every visit).      Assessment & Plan:  I will speak with a gyn onc and see if they have any advice.

## 2015-03-24 ENCOUNTER — Other Ambulatory Visit: Payer: Self-pay | Admitting: Obstetrics & Gynecology

## 2015-03-24 DIAGNOSIS — Z1231 Encounter for screening mammogram for malignant neoplasm of breast: Secondary | ICD-10-CM

## 2015-05-07 ENCOUNTER — Ambulatory Visit (INDEPENDENT_AMBULATORY_CARE_PROVIDER_SITE_OTHER): Payer: Medicare HMO

## 2015-05-07 ENCOUNTER — Ambulatory Visit (INDEPENDENT_AMBULATORY_CARE_PROVIDER_SITE_OTHER): Payer: Medicare HMO | Admitting: Obstetrics & Gynecology

## 2015-05-07 ENCOUNTER — Encounter: Payer: Self-pay | Admitting: Obstetrics & Gynecology

## 2015-05-07 ENCOUNTER — Ambulatory Visit: Payer: Medicare HMO

## 2015-05-07 VITALS — Ht 64.0 in | Wt 149.0 lb

## 2015-05-07 DIAGNOSIS — Z1231 Encounter for screening mammogram for malignant neoplasm of breast: Secondary | ICD-10-CM | POA: Diagnosis not present

## 2015-05-07 DIAGNOSIS — A630 Anogenital (venereal) warts: Secondary | ICD-10-CM | POA: Diagnosis not present

## 2015-05-07 NOTE — Progress Notes (Signed)
   Subjective:    Patient ID: Veronica Strong, female    DOB: 1946-09-27, 69 y.o.   MRN: [id number]  HPI  Sarabelle is her for retreatment of her vaginal condyloma.   Review of Systems     Objective:   Physical Exam  The condyloma are much more shallow. I tried to grasp an edge with a [place]evorkian biopsy forcep but the lesion was so flat that this was impossible.   I coated it with TCA.      Assessment & Plan:  Vaginal condyloma RTC 11/16

## 2015-07-23 IMAGING — CR DG CHEST 2V
2 series · 2 of 2 positions shown · non-contrast
Comparison: 10/13/2010

CLINICAL DATA: Hypertension.

EXAM:
CHEST  2 VIEW

[view not recorded (1 of 2)]
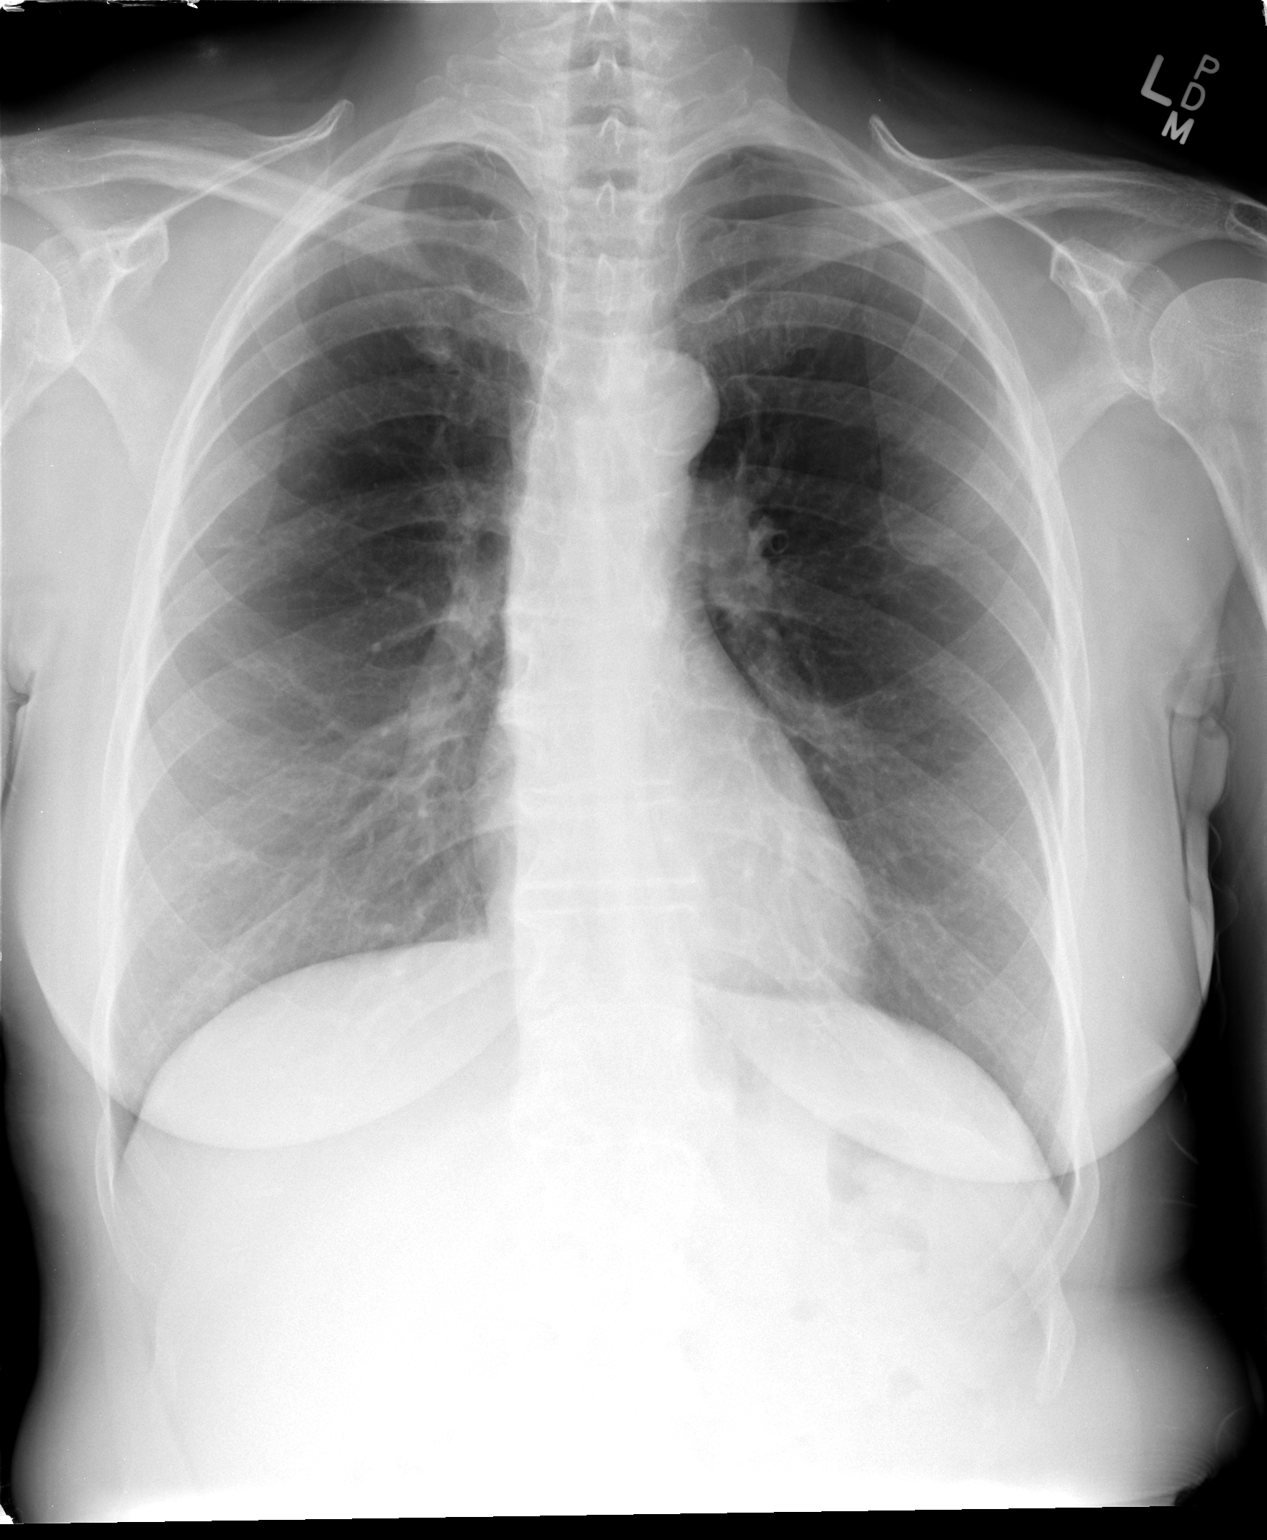

[view not recorded (2 of 2)]
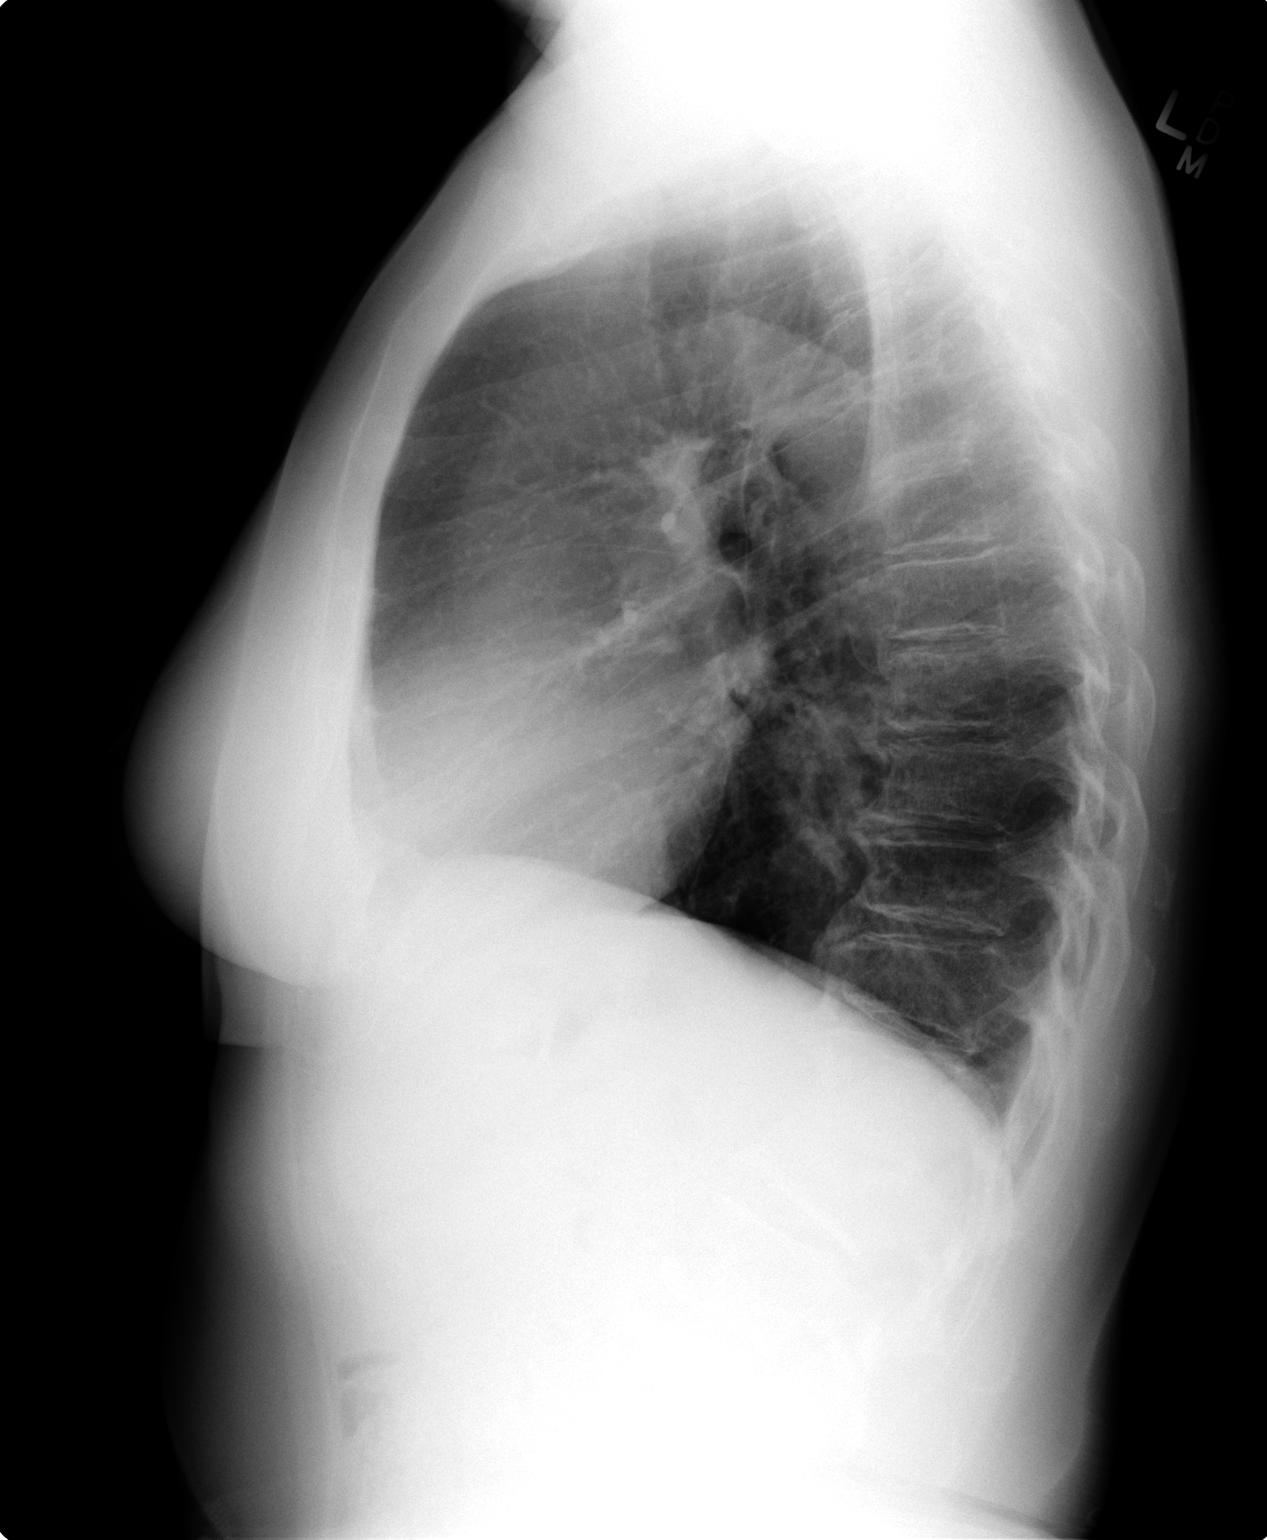

[2 of 2 positions shown; findings below may reference images not displayed]

FINDINGS: Mild compression fracture in the upper lumbar spine, stable.
Degenerative changes in the thoracic spine. Heart and mediastinal
contours are within normal limits. No focal opacities or effusions.
No acute bony abnormality.
IMPRESSION: No active cardiopulmonary disease.

## 2015-07-31 ENCOUNTER — Encounter: Payer: Self-pay | Admitting: Gastroenterology

## 2015-08-27 ENCOUNTER — Encounter: Payer: Self-pay | Admitting: Obstetrics & Gynecology

## 2015-08-27 ENCOUNTER — Ambulatory Visit (INDEPENDENT_AMBULATORY_CARE_PROVIDER_SITE_OTHER): Payer: Medicare HMO | Admitting: Obstetrics & Gynecology

## 2015-08-27 VITALS — BP 124/82 | HR 77 | Resp 16 | Wt 153.0 lb

## 2015-08-27 DIAGNOSIS — A630 Anogenital (venereal) warts: Secondary | ICD-10-CM

## 2015-08-27 NOTE — Progress Notes (Signed)
   Subjective:    Patient ID: Veronica Strong, female    DOB: [birth year], 69 y.o.   MRN: [id number]  HPI  69 yo DW lady here for another TCA application of her vaginal condyloma. She has no complaints.  Review of Systems     Objective:   Physical Exam [id number] Breathing, conversing normally Her vaginal wall condyloma is now so small that I could barely see it. It is flat and only 7 mm wide. I applied TCA.  She tolerated it well.      Assessment & Plan:  Vaginal wall condyloma- responding well to TCA RTC 1 year/prn sooner Continue temovate weekly (she says that it is too expensive to do 3 times per week.)

## 2015-10-19 DEATH — deceased
